# Patient Record
Sex: Female | Born: 1970
Health system: Southern US, Community
[De-identification: ages and names within clinical notes are randomized; demographics above are authoritative.]

## PROBLEM LIST (undated history)

## (undated) DIAGNOSIS — K219 Gastro-esophageal reflux disease without esophagitis: Secondary | ICD-10-CM

## (undated) DIAGNOSIS — J302 Other seasonal allergic rhinitis: Secondary | ICD-10-CM

## (undated) DIAGNOSIS — D649 Anemia, unspecified: Secondary | ICD-10-CM

## (undated) DIAGNOSIS — D259 Leiomyoma of uterus, unspecified: Secondary | ICD-10-CM

## (undated) DIAGNOSIS — D219 Benign neoplasm of connective and other soft tissue, unspecified: Secondary | ICD-10-CM

## (undated) DIAGNOSIS — T7840XA Allergy, unspecified, initial encounter: Secondary | ICD-10-CM

## (undated) HISTORY — DX: Gastro-esophageal reflux disease without esophagitis: K21.9

## (undated) HISTORY — DX: Leiomyoma of uterus, unspecified: D25.9

## (undated) HISTORY — PX: OTHER SURGICAL HISTORY: SHX169

## (undated) HISTORY — DX: Allergy, unspecified, initial encounter: T78.40XA

## (undated) HISTORY — DX: Other seasonal allergic rhinitis: J30.2

## (undated) HISTORY — DX: Anemia, unspecified: D64.9

---

## 2000-02-10 ENCOUNTER — Other Ambulatory Visit: Admission: RE | Admit: 2000-02-10 | Discharge: 2000-02-10 | Payer: Self-pay | Admitting: Family Medicine

## 2006-08-10 ENCOUNTER — Ambulatory Visit: Payer: Self-pay | Admitting: Internal Medicine

## 2006-08-17 ENCOUNTER — Ambulatory Visit: Payer: Self-pay | Admitting: Internal Medicine

## 2006-09-20 ENCOUNTER — Ambulatory Visit: Payer: Self-pay | Admitting: Internal Medicine

## 2007-06-24 DIAGNOSIS — Z87898 Personal history of other specified conditions: Secondary | ICD-10-CM | POA: Insufficient documentation

## 2007-06-25 ENCOUNTER — Ambulatory Visit: Payer: Self-pay | Admitting: Internal Medicine

## 2007-06-25 DIAGNOSIS — K648 Other hemorrhoids: Secondary | ICD-10-CM | POA: Insufficient documentation

## 2007-07-26 ENCOUNTER — Telehealth: Payer: Self-pay | Admitting: Internal Medicine

## 2007-08-09 ENCOUNTER — Ambulatory Visit: Payer: Self-pay | Admitting: Internal Medicine

## 2007-08-09 DIAGNOSIS — K5901 Slow transit constipation: Secondary | ICD-10-CM

## 2007-10-16 ENCOUNTER — Ambulatory Visit: Payer: Self-pay | Admitting: Internal Medicine

## 2007-10-16 LAB — CONVERTED CEMR LAB
ALT: 11 units/L (ref 0–35)
AST: 17 units/L (ref 0–37)
Albumin: 3.9 g/dL (ref 3.5–5.2)
BUN: 8 mg/dL (ref 6–23)
Basophils Absolute: 0 10*3/uL (ref 0.0–0.1)
Blood in Urine, dipstick: NEGATIVE
Calcium: 9.5 mg/dL (ref 8.4–10.5)
Chloride: 104 meq/L (ref 96–112)
Cholesterol: 128 mg/dL (ref 0–200)
Eosinophils Absolute: 0 10*3/uL (ref 0.0–0.6)
GFR calc non Af Amer: 120 mL/min
Glucose, Urine, Semiquant: NEGATIVE
MCHC: 33.6 g/dL (ref 30.0–36.0)
MCV: 80.8 fL (ref 78.0–100.0)
Platelets: 186 10*3/uL (ref 150–400)
RBC: 4.79 M/uL (ref 3.87–5.11)
Total CHOL/HDL Ratio: 2.8
Triglycerides: 23 mg/dL (ref 0–149)
WBC Urine, dipstick: NEGATIVE
WBC: 3.7 10*3/uL — ABNORMAL LOW (ref 4.5–10.5)
pH: 6

## 2007-10-31 ENCOUNTER — Ambulatory Visit: Payer: Self-pay | Admitting: Internal Medicine

## 2007-10-31 DIAGNOSIS — D509 Iron deficiency anemia, unspecified: Secondary | ICD-10-CM | POA: Insufficient documentation

## 2008-03-30 ENCOUNTER — Telehealth: Payer: Self-pay | Admitting: Internal Medicine

## 2008-03-31 ENCOUNTER — Ambulatory Visit: Payer: Self-pay | Admitting: Internal Medicine

## 2008-04-03 ENCOUNTER — Emergency Department (HOSPITAL_COMMUNITY): Admission: EM | Admit: 2008-04-03 | Discharge: 2008-04-04 | Payer: Self-pay | Admitting: Emergency Medicine

## 2008-05-08 ENCOUNTER — Ambulatory Visit: Payer: Self-pay | Admitting: Gastroenterology

## 2008-05-08 DIAGNOSIS — K5900 Constipation, unspecified: Secondary | ICD-10-CM | POA: Insufficient documentation

## 2008-06-10 ENCOUNTER — Telehealth: Payer: Self-pay | Admitting: Gastroenterology

## 2008-07-15 ENCOUNTER — Ambulatory Visit: Payer: Self-pay | Admitting: Internal Medicine

## 2008-07-15 DIAGNOSIS — M26609 Unspecified temporomandibular joint disorder, unspecified side: Secondary | ICD-10-CM | POA: Insufficient documentation

## 2008-10-23 ENCOUNTER — Ambulatory Visit: Payer: Self-pay | Admitting: Internal Medicine

## 2008-10-23 LAB — CONVERTED CEMR LAB
AST: 17 units/L (ref 0–37)
Albumin: 3.9 g/dL (ref 3.5–5.2)
Alkaline Phosphatase: 48 units/L (ref 39–117)
BUN: 7 mg/dL (ref 6–23)
Bilirubin, Direct: 0.1 mg/dL (ref 0.0–0.3)
Chloride: 106 meq/L (ref 96–112)
Eosinophils Absolute: 0.1 10*3/uL (ref 0.0–0.7)
Eosinophils Relative: 1.9 % (ref 0.0–5.0)
GFR calc non Af Amer: 100 mL/min
HDL: 50.5 mg/dL (ref >39.0)
MCV: 82.5 fL (ref 78.0–100.0)
Monocytes Relative: 10.6 % (ref 3.0–12.0)
Neutrophils Relative %: 43 % (ref 43.0–77.0)
Platelets: 170 10*3/uL (ref 150–400)
Potassium: 4.8 meq/L (ref 3.5–5.1)
RDW: 12.3 % (ref 11.5–14.6)
Sodium: 142 meq/L (ref 135–145)
Total Bilirubin: 0.7 mg/dL (ref 0.3–1.2)
Total CHOL/HDL Ratio: 2.9
VLDL: 5 mg/dL (ref 0–40)
WBC: 2.8 10*3/uL — ABNORMAL LOW (ref 4.5–10.5)

## 2008-10-30 ENCOUNTER — Ambulatory Visit: Payer: Self-pay | Admitting: Internal Medicine

## 2008-10-30 DIAGNOSIS — K219 Gastro-esophageal reflux disease without esophagitis: Secondary | ICD-10-CM | POA: Insufficient documentation

## 2009-01-15 ENCOUNTER — Telehealth: Payer: Self-pay | Admitting: Internal Medicine

## 2009-02-08 ENCOUNTER — Ambulatory Visit: Payer: Self-pay | Admitting: Gastroenterology

## 2009-03-18 ENCOUNTER — Encounter: Payer: Self-pay | Admitting: Internal Medicine

## 2009-05-19 LAB — CONVERTED CEMR LAB: Pap Smear: NORMAL

## 2009-11-03 ENCOUNTER — Ambulatory Visit: Payer: Self-pay | Admitting: Internal Medicine

## 2009-11-03 LAB — CONVERTED CEMR LAB
Bilirubin Urine: NEGATIVE
Nitrite: NEGATIVE
Protein, U semiquant: NEGATIVE
Urobilinogen, UA: 0.2

## 2010-08-31 ENCOUNTER — Ambulatory Visit: Payer: Self-pay | Admitting: Internal Medicine

## 2010-08-31 DIAGNOSIS — M549 Dorsalgia, unspecified: Secondary | ICD-10-CM | POA: Insufficient documentation

## 2010-08-31 LAB — CONVERTED CEMR LAB
Blood in Urine, dipstick: NEGATIVE
Glucose, Urine, Semiquant: NEGATIVE
Protein, U semiquant: NEGATIVE
Specific Gravity, Urine: 1.02
pH: 7

## 2010-09-01 ENCOUNTER — Encounter: Payer: Self-pay | Admitting: Internal Medicine

## 2010-09-11 LAB — CONVERTED CEMR LAB: GC Probe Amp, Urine: NEGATIVE

## 2010-09-21 ENCOUNTER — Telehealth: Payer: Self-pay | Admitting: Internal Medicine

## 2010-11-09 ENCOUNTER — Ambulatory Visit: Payer: Self-pay | Admitting: Internal Medicine

## 2010-11-09 LAB — CONVERTED CEMR LAB
Alkaline Phosphatase: 44 units/L (ref 39–117)
Basophils Relative: 0.6 % (ref 0.0–3.0)
Bilirubin, Direct: 0.1 mg/dL (ref 0.0–0.3)
CO2: 30 meq/L (ref 19–32)
Calcium: 9 mg/dL (ref 8.4–10.5)
Creatinine, Ser: 0.5 mg/dL (ref 0.4–1.2)
Eosinophils Absolute: 0.1 10*3/uL (ref 0.0–0.7)
GFR calc non Af Amer: 161.52 mL/min (ref >60.00)
HDL: 51.4 mg/dL (ref >39.00)
LDL Cholesterol: 98 mg/dL (ref 0–99)
Lymphocytes Relative: 57.5 % — ABNORMAL HIGH (ref 12.0–46.0)
MCHC: 33.6 g/dL (ref 30.0–36.0)
Monocytes Relative: 9.4 % (ref 3.0–12.0)
Neutrophils Relative %: 30.7 % — ABNORMAL LOW (ref 43.0–77.0)
RBC: 4.87 M/uL (ref 3.87–5.11)
Total CHOL/HDL Ratio: 3
Total Protein: 7.1 g/dL (ref 6.0–8.3)
Triglycerides: 22 mg/dL (ref 0.0–149.0)
VLDL: 4.4 mg/dL (ref 0.0–40.0)
WBC: 2.8 10*3/uL — ABNORMAL LOW (ref 4.5–10.5)

## 2010-11-28 ENCOUNTER — Ambulatory Visit
Admission: RE | Admit: 2010-11-28 | Discharge: 2010-11-28 | Payer: Self-pay | Source: Home / Self Care | Attending: Internal Medicine | Admitting: Internal Medicine

## 2010-12-18 LAB — CONVERTED CEMR LAB
AST: 16 units/L (ref 0–37)
Albumin: 3.9 g/dL (ref 3.5–5.2)
Alkaline Phosphatase: 40 units/L (ref 39–117)
Basophils Relative: 0.2 % (ref 0.0–3.0)
Bilirubin, Direct: 0 mg/dL (ref 0.0–0.3)
Calcium: 8.9 mg/dL (ref 8.4–10.5)
Creatinine, Ser: 0.6 mg/dL (ref 0.4–1.2)
Eosinophils Absolute: 0 10*3/uL (ref 0.0–0.7)
GFR calc non Af Amer: 143.79 mL/min (ref >60)
HDL: 48.8 mg/dL (ref >39.00)
Hemoglobin: 13 g/dL (ref 12.0–15.0)
LDL Cholesterol: 69 mg/dL (ref 0–99)
Lymphocytes Relative: 31.5 % (ref 12.0–46.0)
Monocytes Relative: 8.8 % (ref 3.0–12.0)
Neutro Abs: 2 10*3/uL (ref 1.4–7.7)
Neutrophils Relative %: 58.5 % (ref 43.0–77.0)
RBC: 4.67 M/uL (ref 3.87–5.11)
Sodium: 137 meq/L (ref 135–145)
Total CHOL/HDL Ratio: 3
Total Protein: 7.2 g/dL (ref 6.0–8.3)
Triglycerides: 34 mg/dL (ref 0.0–149.0)
VLDL: 6.8 mg/dL (ref 0.0–40.0)
WBC: 3.4 10*3/uL — ABNORMAL LOW (ref 4.5–10.5)

## 2010-12-20 NOTE — Assessment & Plan Note (Signed)
Summary: BLADDER INFECTION   Vital Signs:  Patient profile:   40 year old female Height:      65 inches Weight:      150 pounds BMI:     25.05 Temp:     98.2 degrees F oral Pulse rate:   80 / minute Resp:     14 per minute BP sitting:   110 / 70  (left arm)  Vitals Entered By: Willy Eddy, LPN (August 31, 2010 9:59 AM) CC: c/o dysuria Is Patient Diabetic? No   Primary Care Provider:  Darryll Capers MD  CC:  c/o dysuria.  History of Present Illness: lower back pain, pressure and frequency mild oder with the urination and a mild discharge with iritaton to uretha on wiping Just married Had been abstinant  prior has  been treated for irritaton by the GYN    Preventive Screening-Counseling & Management  Alcohol-Tobacco     Smoking Status: quit     Passive Smoke Exposure: no     Tobacco Counseling: to remain off tobacco products  Problems Prior to Update: 1)  Health Maintenance Exam  (ICD-V70.0) 2)  Gerd  (ICD-530.81) 3)  Unspecified Temporomandibular Joint Disorders  (ICD-524.60) 4)  Constipation  (ICD-564.00) 5)  Hemorrhoids-internal  (ICD-455.0) 6)  Acute Maxillary Sinusitis  (ICD-461.0) 7)  Other Specified Iron Deficiency Anemias  (ICD-280.8) 8)  Constipation, Slow Transit  (ICD-564.01) 9)  Hemorrhoids, Internal, With Bleeding  (ICD-455.2) 10)  Family History of Cad Female 1st Degree Relative <60  (ICD-V16.49) 11)  Family History of Cad Female 1st Degree Relative <50  (ICD-V17.3) 12)  Fibrocystic Breast Disease, Hx of  (ICD-V13.9)  Current Problems (verified): 1)  Uti  (ICD-599.0) 2)  Health Maintenance Exam  (ICD-V70.0) 3)  Gerd  (ICD-530.81) 4)  Unspecified Temporomandibular Joint Disorders  (ICD-524.60) 5)  Constipation  (ICD-564.00) 6)  Hemorrhoids-internal  (ICD-455.0) 7)  Acute Maxillary Sinusitis  (ICD-461.0) 8)  Other Specified Iron Deficiency Anemias  (ICD-280.8) 9)  Constipation, Slow Transit  (ICD-564.01) 10)  Hemorrhoids, Internal, With  Bleeding  (ICD-455.2) 11)  Family History of Cad Female 1st Degree Relative <60  (ICD-V16.49) 12)  Family History of Cad Female 1st Degree Relative <50  (ICD-V17.3) 13)  Fibrocystic Breast Disease, Hx of  (ICD-V13.9)  Medications Prior to Update: 1)  Therapeutic Multivitamin   Tabs (Multiple Vitamin) .... Once Daily 2)  Kapidex 60 Mg Cpdr (Dexlansoprazole) .... One Tablet By Mouth Once Daily 3)  Omega-3 1000 Mg Caps (Omega-3 Fatty Acids) .Marland Kitchen.. 1 Once Daily 4)  B Complex-B12  Tabs (B Complex Vitamins) .Marland Kitchen.. 1 Once Daily 5)  Cyclobenzaprine Hcl 5 Mg Tabs (Cyclobenzaprine Hcl) .... One Po Q Hs  Current Medications (verified): 1)  Therapeutic Multivitamin   Tabs (Multiple Vitamin) .... Once Daily 2)  Birth Control Pills 3)  Metrogel-Vaginal 0.75 % Gel (Metronidazole) .... Apply Per Vagina  For Days Q Hs  Allergies (verified): No Known Drug Allergies  Past History:  Family History: Last updated: 05/08/2008 Family History of CAD Female 1st degree relative <50 Family History of CAD Female 1st degree relative <60 Family History High cholesterol No FH of Colon Cancer: Family History of Diabetes: mother, father and maternal grandmother  Social History: Last updated: 07/15/2008 Occupation:church Single Former Smoker Alcohol Use - yes Daily Caffeine Use no sexual activity for 11 years  Risk Factors: Exercise: yes (07/15/2008)  Risk Factors: Smoking Status: quit (08/31/2010) Passive Smoke Exposure: no (08/31/2010)  Past medical, surgical, family and social histories (including  risk factors) reviewed, and no changes noted (except as noted below).  Past Medical History: Reviewed history from 10/30/2008 and no changes required. fibrocystic breast dz hemorrhoids GERD  Past Surgical History: Reviewed history from 06/25/2007 and no changes required. Denies surgical history  Family History: Reviewed history from 05/08/2008 and no changes required. Family History of CAD Female 1st  degree relative <50 Family History of CAD Female 1st degree relative <60 Family History High cholesterol No FH of Colon Cancer: Family History of Diabetes: mother, father and maternal grandmother  Social History: Reviewed history from 07/15/2008 and no changes required. Occupation:church Single Former Smoker Alcohol Use - yes Daily Caffeine Use no sexual activity for 11 years  Review of Systems  The patient denies anorexia, fever, weight loss, weight gain, vision loss, decreased hearing, hoarseness, chest pain, syncope, dyspnea on exertion, peripheral edema, prolonged cough, headaches, hemoptysis, abdominal pain, melena, hematochezia, severe indigestion/heartburn, hematuria, incontinence, genital sores, muscle weakness, suspicious skin lesions, transient blindness, difficulty walking, depression, unusual weight change, abnormal bleeding, enlarged lymph nodes, angioedema, and breast masses.    Physical Exam  General:  Well developed, well nourished, no acute distress. Head:  Normocephalic and atraumatic. Eyes:  pupils equal and pupils round.   Ears:  R ear normal.   Nose:  no external deformity.   Neck:  No deformities, masses, or tenderness noted. Lungs:  Clear throughout to auscultation. Heart:  Regular rate and rhythm; no murmurs, rubs,  or bruits. Abdomen:  Soft, nontender and nondistended. No masses, hepatosplenomegaly or hernias noted. Normal bowel sounds. Msk:  Symmetrical with no gross deformities. Normal posture. Neurologic:  alert & oriented X3 and gait normal.     Impression & Recommendations:  Problem # 1:  VAGINITIS, BACTERIAL (ICD-616.10)  Discussed symptomatic relief and treatment options.   Her updated medication list for this problem includes:    Metrogel-vaginal 0.75 % Gel (Metronidazole) .Marland Kitchen... Apply per vagina  for days q hs  Problem # 2:  UTI (ICD-599.0) the pt has symptoms that are more consistant with vaginosis but culture was sent and since she juyts  began activity (newly wed ) wil cover bases for STD Orders: UA Dipstick w/Micro (automated) (81001) T-Culture, Urine (82956-21308) T-GC Probe, urine (65784-69629) T-Chlamydia  Probe, urine (52841-32440)  Encouraged to push clear liquids, get enough rest, and take acetaminophen as needed. To be seen in 10 days if no improvement, sooner if worse.  Problem # 3:  BACK PAIN, ACUTE (ICD-724.5)  possible referred pain The following medications were removed from the medication list:    Cyclobenzaprine Hcl 5 Mg Tabs (Cyclobenzaprine hcl) ..... One po q hs  Discussed use of moist heat or ice, modified activities, medications, and stretching/strengthening exercises. Back care instructions given. To be seen in 2 weeks if no improvement; sooner if worsening of symptoms.   Complete Medication List: 1)  Therapeutic Multivitamin Tabs (Multiple vitamin) .... Once daily 2)  Birth Control Pills  3)  Metrogel-vaginal 0.75 % Gel (Metronidazole) .... Apply per vagina  for days q hs  Patient Instructions: 1)  Please schedule a follow-up appointment as needed. Prescriptions: METROGEL-VAGINAL 0.75 % GEL (METRONIDAZOLE) apply per vagina  for days q HS  #7 day x 0   Entered and Authorized by:   Stacie Glaze MD   Signed by:   Stacie Glaze MD on 08/31/2010   Method used:   Electronically to        [facility] [address] [phone]* (retail)  [address]       [place], [place]  [address]       Ph: [phone] or [phone]       Fax: [phone]   RxID:   [id number]   Laboratory Results   Urine Tests    Routine Urinalysis   Color: yellow Appearance: Clear Glucose: negative   (Normal Range: Negative) Bilirubin: negative   (Normal Range: Negative) Ketone: trace (5)   (Normal Range: Negative) Spec. Gravity: 1.020   (Normal Range: 1.003-1.035) Blood: negative   (Normal Range: Negative) pH: 7.0   (Normal Range: 5.0-8.0) Protein: negative   (Normal Range:  Negative) Urobilinogen: 0.2   (Normal Range: 0-1) Nitrite: negative   (Normal Range: Negative) Leukocyte Esterace: negative   (Normal Range: Negative)    Comments: Rita Ohara  August 31, 2010 10:00 AM

## 2010-12-20 NOTE — Progress Notes (Signed)
Summary: preg test?  Phone Note Call from Patient   Caller: Patient Call For: Stacie Glaze MD Summary of Call: Pt called to see if we did a pregnancy test on her the last visit when she had an possible UTI.  Notified no preg. test was performed. Initial call taken by: [facility] CMA AAMA,  September 21, 2010 1:23 PM

## 2010-12-22 NOTE — Assessment & Plan Note (Signed)
Summary: CPX/CJR   Vital Signs:  Patient profile:   40 year old female Height:      65 inches Weight:      142 pounds BMI:     23.72 Temp:     98.2 degrees F oral Pulse rate:   60 / minute Resp:     14 per minute BP sitting:   100 / 60  (left arm)  Vitals Entered By: Willy Eddy, LPN (November 28, 2010 3:19 PM) CC: cpx Is Patient Diabetic? No   Primary Care Provider:  Darryll Capers MD  CC:  cpx.  History of Present Illness: The pt was asked about all immunizations, health maint. services that are appropriate to their age and was given guidance on diet exercize  and weight management   Preventive Screening-Counseling & Management  Alcohol-Tobacco     Smoking Status: quit     Passive Smoke Exposure: no     Tobacco Counseling: to remain off tobacco products  Problems Prior to Update: 1)  Back Pain, Acute  (ICD-724.5) 2)  Vaginitis, Bacterial  (ICD-616.10) 3)  Uti  (ICD-599.0) 4)  Health Maintenance Exam  (ICD-V70.0) 5)  Gerd  (ICD-530.81) 6)  Unspecified Temporomandibular Joint Disorders  (ICD-524.60) 7)  Constipation  (ICD-564.00) 8)  Hemorrhoids-internal  (ICD-455.0) 9)  Acute Maxillary Sinusitis  (ICD-461.0) 10)  Other Specified Iron Deficiency Anemias  (ICD-280.8) 11)  Constipation, Slow Transit  (ICD-564.01) 12)  Hemorrhoids, Internal, With Bleeding  (ICD-455.2) 13)  Family History of Cad Female 1st Degree Relative <60  (ICD-V16.49) 14)  Family History of Cad Female 1st Degree Relative <50  (ICD-V17.3) 15)  Fibrocystic Breast Disease, Hx of  (ICD-V13.9)  Current Problems (verified): 1)  Back Pain, Acute  (ICD-724.5) 2)  Vaginitis, Bacterial  (ICD-616.10) 3)  Uti  (ICD-599.0) 4)  Health Maintenance Exam  (ICD-V70.0) 5)  Gerd  (ICD-530.81) 6)  Unspecified Temporomandibular Joint Disorders  (ICD-524.60) 7)  Constipation  (ICD-564.00) 8)  Hemorrhoids-internal  (ICD-455.0) 9)  Acute Maxillary Sinusitis  (ICD-461.0) 10)  Other Specified Iron Deficiency  Anemias  (ICD-280.8) 11)  Constipation, Slow Transit  (ICD-564.01) 12)  Hemorrhoids, Internal, With Bleeding  (ICD-455.2) 13)  Family History of Cad Female 1st Degree Relative <60  (ICD-V16.49) 14)  Family History of Cad Female 1st Degree Relative <50  (ICD-V17.3) 15)  Fibrocystic Breast Disease, Hx of  (ICD-V13.9)  Medications Prior to Update: 1)  Therapeutic Multivitamin   Tabs (Multiple Vitamin) .... Once Daily 2)  Birth Control Pills 3)  Metrogel-Vaginal 0.75 % Gel (Metronidazole) .... Apply Per Vagina  For Days Q Hs  Current Medications (verified): 1)  Therapeutic Multivitamin   Tabs (Multiple Vitamin) .... Once Daily 2)  Cavan Prenatal/ec Calcium 28-1 Mg Tbec (Prenatal W/o A Vit-Fe Fum-Fa) .... One By Mouth Daily As Directed  Allergies (verified): No Known Drug Allergies  Past History:  Family History: Last updated: 05/08/2008 Family History of CAD Female 1st degree relative <50 Family History of CAD Female 1st degree relative <60 Family History High cholesterol No FH of Colon Cancer: Family History of Diabetes: mother, father and maternal grandmother  Social History: Last updated: 07/15/2008 Occupation:church Single Former Smoker Alcohol Use - yes Daily Caffeine Use no sexual activity for 11 years  Risk Factors: Exercise: yes (07/15/2008)  Risk Factors: Smoking Status: quit (11/28/2010) Passive Smoke Exposure: no (11/28/2010)  Past medical, surgical, family and social histories (including risk factors) reviewed, and no changes noted (except as noted below).  Past Medical History: Reviewed  history from 10/30/2008 and no changes required. fibrocystic breast dz hemorrhoids GERD  Past Surgical History: Reviewed history from 06/25/2007 and no changes required. Denies surgical history  Family History: Reviewed history from 05/08/2008 and no changes required. Family History of CAD Female 1st degree relative <50 Family History of CAD Female 1st degree relative  <60 Family History High cholesterol No FH of Colon Cancer: Family History of Diabetes: mother, father and maternal grandmother  Social History: Reviewed history from 07/15/2008 and no changes required. Occupation:church Single Former Smoker Alcohol Use - yes Daily Caffeine Use no sexual activity for 11 years  Review of Systems  The patient denies anorexia, fever, weight loss, weight gain, vision loss, decreased hearing, hoarseness, chest pain, syncope, dyspnea on exertion, peripheral edema, prolonged cough, headaches, hemoptysis, abdominal pain, melena, hematochezia, severe indigestion/heartburn, hematuria, incontinence, genital sores, muscle weakness, suspicious skin lesions, transient blindness, difficulty walking, depression, unusual weight change, abnormal bleeding, enlarged lymph nodes, angioedema, and breast masses.    Physical Exam  General:  Well developed, well nourished, no acute distress. Head:  Normocephalic and atraumatic. Eyes:  pupils equal and pupils round.   Ears:  R ear normal.   Nose:  no external deformity.   Mouth:  No deformity or lesions, dentition normal. Neck:  No deformities, masses, or tenderness noted. Lungs:  Clear throughout to auscultation. Heart:  Regular rate and rhythm; no murmurs, rubs,  or bruits. Abdomen:  Soft, nontender and nondistended. No masses, hepatosplenomegaly or hernias noted. Normal bowel sounds. Msk:  Symmetrical with no gross deformities. Normal posture. Extremities:  No clubbing, cyanosis, edema or deformities noted. Neurologic:  alert & oriented X3 and gait normal.     Impression & Recommendations:  Problem # 1:  HEALTH MAINTENANCE EXAM (ICD-V70.0) Assessment Unchanged The pt was asked about all immunizations, health maint. services that are appropriate to their age and was given guidance on diet exercize  and weight management  Pap smear: normal (05/19/2009) Chol: 154 (11/09/2010)   HDL: 51.40 (11/09/2010)   LDL: 98  (11/09/2010)   TG: 22.0 (11/09/2010) TSH: 0.89 (11/09/2010)    Discussed using sunscreen, use of alcohol, drug use, self breast exam, routine dental care, routine eye care, schedule for GYN exam, routine physical exam, seat belts, multiple vitamins, osteoporosis prevention, adequate calcium intake in diet, recommendations for immunizations, mammograms and Pap smears.  Discussed exercise and checking cholesterol.  Discussed gun safety, safe sex, and contraception.  Complete Medication List: 1)  Therapeutic Multivitamin Tabs (Multiple vitamin) .... Once daily 2)  Cavan Prenatal/ec Calcium 28-1 Mg Tbec (Prenatal w/o a vit-fe fum-fa) .... One by mouth daily as directed  Patient Instructions: 1)  Please schedule a follow-up appointment in 1 year.  CPX Prescriptions: CAVAN PRENATAL/EC CALCIUM 28-1 MG TBEC (PRENATAL W/O A VIT-FE FUM-FA) one by mouth daily as directed  #30 x 11   Entered and Authorized by:   Stacie Glaze MD   Signed by:   Stacie Glaze MD on 11/28/2010   Method used:   Electronically to        [facility] [address] [phone]* (retail)       [address]       [place], [place]  [address]       Ph: [phone] or [phone]       Fax: [phone]   RxID:   [id number]    Orders Added: 1)  Est. Patient 18-39 years [phone]

## 2011-03-15 ENCOUNTER — Ambulatory Visit (INDEPENDENT_AMBULATORY_CARE_PROVIDER_SITE_OTHER): Payer: Managed Care, Other (non HMO) | Admitting: Internal Medicine

## 2011-03-15 ENCOUNTER — Encounter: Payer: Self-pay | Admitting: Internal Medicine

## 2011-03-15 VITALS — BP 110/76 | HR 76 | Temp 98.6°F | Resp 14 | Ht 65.5 in | Wt 140.0 lb

## 2011-03-15 DIAGNOSIS — N760 Acute vaginitis: Secondary | ICD-10-CM

## 2011-03-15 DIAGNOSIS — J0100 Acute maxillary sinusitis, unspecified: Secondary | ICD-10-CM

## 2011-03-15 DIAGNOSIS — A499 Bacterial infection, unspecified: Secondary | ICD-10-CM

## 2011-03-15 MED ORDER — FLUCONAZOLE 150 MG PO TABS: 150.0000 mg | ORAL_TABLET | Freq: Once | ORAL | Status: AC

## 2011-03-15 MED ORDER — PHENYLEPHRINE-APAP-GUAIFENESIN 10-650-400 MG/20ML PO LIQD: 5.0000 mL | Freq: Four times a day (QID) | ORAL | Status: DC | PRN

## 2011-03-15 MED ORDER — LEVOFLOXACIN 500 MG PO TABS: 500.0000 mg | ORAL_TABLET | Freq: Every day | ORAL | Status: AC

## 2011-03-15 NOTE — Assessment & Plan Note (Signed)
Patient has a history of recurrent maxillary sinusitis and its apparent that she had an episode. She also has eustachian tube dysfunction causing pain in her left ear she will need antibiotic therapy and appropriate decongestants because she is prone to yeast infection I will give her Diflucan

## 2011-03-15 NOTE — Progress Notes (Signed)
  Subjective:    Patient ID: Lauren Wiley, female    DOB: 1971-05-07, 40 y.o.   MRN: [id number]  Cough Associated symptoms include ear pain and rhinorrhea. Pertinent negatives include no eye redness, headaches, myalgias, postnasal drip, rash, shortness of breath or wheezing.  Otalgia  Associated symptoms include rhinorrhea. Pertinent negatives include no abdominal pain, coughing, headaches, neck pain or rash.   patient is a 40 year old African American female who presents with recurrent sinusitis.  Symptoms started on Saturday with initially a scratchy throat and nasal congestion now coughing with change in the color of her sputum.  She denies any fever or chills currently she has a history of recurrent sinusitis or seasonal component due to allergic rhinitis.      Review of Systems  Constitutional: Negative for activity change, appetite change and fatigue.  HENT: Positive for ear pain, congestion and rhinorrhea. Negative for neck pain, postnasal drip and sinus pressure.   Eyes: Negative for redness and visual disturbance.  Respiratory: Negative for cough, shortness of breath and wheezing.   Gastrointestinal: Negative for abdominal pain and abdominal distention.  Genitourinary: Negative for dysuria, frequency and menstrual problem.  Musculoskeletal: Negative for myalgias, joint swelling and arthralgias.  Skin: Negative for rash and wound.  Neurological: Negative for dizziness, weakness and headaches.  Hematological: Negative for adenopathy. Does not bruise/bleed easily.  Psychiatric/Behavioral: Negative for sleep disturbance and decreased concentration.   History reviewed. No pertinent past medical history. History reviewed. No pertinent past surgical history.  reports that she quit smoking about 20 years ago. She does not have any smokeless tobacco history on file. She reports that she does not drink alcohol or use illicit drugs. family history is not on file. Allergies no  known allergies     Objective:   Physical Exam  Constitutional: She is oriented to person, place, and time. She appears well-developed and well-nourished. No distress.  HENT:  Head: Normocephalic and atraumatic.  Right Ear: External ear normal.  Left Ear: External ear normal.  Nose: Nose normal.  Mouth/Throat: No oropharyngeal exudate.       Oropharynx posterior cobblestoning turbinates were erythematous and swollen there was airflow obstruction  Eyes: Conjunctivae and EOM are normal. Pupils are equal, round, and reactive to light.  Neck: Normal range of motion. Neck supple. No JVD present. No tracheal deviation present. No thyromegaly present.  Cardiovascular: Normal rate, regular rhythm, normal heart sounds and intact distal pulses.   No murmur heard. Pulmonary/Chest: Effort normal and breath sounds normal. She has no wheezes. She exhibits no tenderness.  Abdominal: Soft. Bowel sounds are normal.  Musculoskeletal: Normal range of motion. She exhibits no edema and no tenderness.  Lymphadenopathy:    She has no cervical adenopathy.  Neurological: She is alert and oriented to person, place, and time. She has normal reflexes. No cranial nerve deficit.  Skin: Skin is warm and dry. She is not diaphoretic.  Psychiatric: She has a normal mood and affect. Her behavior is normal.          Assessment & Plan:  Recurrent sinusitis will use Levaquin 500 mg for 10 days she'll be covered for probable yeast infection with Diflucan now and in 7 days for decongestant we will use Mucinex fast max liquid she is given a prescription for these medications and instructions about her sinusitis if symptoms persist a sinus x-ray should be obtained

## 2011-04-07 NOTE — Assessment & Plan Note (Signed)
[facility]                                   ON-CALL NOTE   NAME:Lauren Wiley, Lauren Wiley                      MRN:          [id number]  DATE:09/08/2006                            DOB:          January 11, 1971    Caller is [employer].   PRIMARY:  Dr. Lovell Sheehan.   PHONE NUMBER:  [phone]   SUBJECTIVE:  Left ear pain for 4 days.  Symptoms go away with Aleve, but  then come back.  The patient also has some sinus drainage and pressure.   ASSESSMENT AND PLAN:  Saturday Clinic is now over.  I recommend Claritin D  and an appointment with primary care doctor on Monday.       Kerby Nora, MD      AB/MedQ  DD:  09/09/2006  DT:  09/10/2006  Job #:  [id number]   cc:   Stacie Glaze, MD

## 2011-12-04 ENCOUNTER — Other Ambulatory Visit (HOSPITAL_COMMUNITY): Payer: Self-pay | Admitting: Obstetrics and Gynecology

## 2011-12-04 DIAGNOSIS — N979 Female infertility, unspecified: Secondary | ICD-10-CM

## 2011-12-08 ENCOUNTER — Ambulatory Visit (HOSPITAL_COMMUNITY)
Admission: RE | Admit: 2011-12-08 | Discharge: 2011-12-08 | Disposition: A | Discharge status: Home / Self Care | Payer: Managed Care, Other (non HMO) | Source: Ambulatory Visit | Attending: Obstetrics and Gynecology | Admitting: Obstetrics and Gynecology

## 2011-12-08 DIAGNOSIS — N979 Female infertility, unspecified: Secondary | ICD-10-CM | POA: Insufficient documentation

## 2011-12-08 MED ORDER — IOHEXOL 300 MG/ML  SOLN
20.0000 mL | Freq: Once | INTRAMUSCULAR | Status: AC | PRN
  Administered 2011-12-08: 20 mL

## 2012-03-21 ENCOUNTER — Other Ambulatory Visit: Payer: Self-pay | Admitting: Obstetrics and Gynecology

## 2012-03-21 DIAGNOSIS — Z1231 Encounter for screening mammogram for malignant neoplasm of breast: Secondary | ICD-10-CM

## 2012-03-28 ENCOUNTER — Ambulatory Visit
Admission: RE | Admit: 2012-03-28 | Discharge: 2012-03-28 | Disposition: A | Discharge status: Home / Self Care | Payer: Managed Care, Other (non HMO) | Source: Ambulatory Visit | Attending: Obstetrics and Gynecology | Admitting: Obstetrics and Gynecology

## 2012-03-28 DIAGNOSIS — Z1231 Encounter for screening mammogram for malignant neoplasm of breast: Secondary | ICD-10-CM

## 2013-11-19 ENCOUNTER — Encounter: Payer: Self-pay | Admitting: Family Medicine

## 2013-11-19 ENCOUNTER — Ambulatory Visit (INDEPENDENT_AMBULATORY_CARE_PROVIDER_SITE_OTHER): Payer: BC Managed Care – PPO | Admitting: Family Medicine

## 2013-11-19 VITALS — BP 116/83 | HR 88 | Temp 99.2°F | Resp 16 | Ht 65.5 in | Wt 158.0 lb

## 2013-11-19 DIAGNOSIS — J019 Acute sinusitis, unspecified: Secondary | ICD-10-CM

## 2013-11-19 DIAGNOSIS — Z23 Encounter for immunization: Secondary | ICD-10-CM

## 2013-11-19 MED ORDER — AMOXICILLIN-POT CLAVULANATE 875-125 MG PO TABS: 1.0000 | ORAL_TABLET | Freq: Two times a day (BID) | ORAL | Status: DC

## 2013-11-19 NOTE — Progress Notes (Signed)
OFFICE NOTE  11/19/2013  CC:  Chief Complaint  Patient presents with  . Sinus Problem     HPI: Patient is a 42 y.o. African-American female who is here for sinus congestion. Onset about 1 wk ago, nasal/sinus congestion, ear starting to ache, some PND and coughing. No fever.  No pain in face or upper teeth.  No wheezing or chest tightness.  Some raw throat. Mucinex D and flonase used so far and not much help.   Pertinent PMH:  Constipation Hx of iron def anemia Int hemorrhoids Hx of maxillary sinusitis Former smoker: quit 1992  MEDS:  None except as per HPI  PE: Blood pressure 116/83, pulse 88, temperature 99.2 F (37.3 C), temperature source Temporal, resp. rate 16, height 5' 5.5" (1.664 m), weight 158 lb (71.668 kg), last menstrual period 11/11/2013, SpO2 98.00%. VS: noted--normal. Gen: alert, NAD, NONTOXIC APPEARING. HEENT: eyes without injection, drainage, or swelling.  Ears: EACs clear, TMs with normal light reflex and landmarks.  Nose: Clear rhinorrhea, with some dried, crusty exudate adherent to mildly injected mucosa.  No purulent d/c.  No paranasal sinus TTP.  No facial swelling.  Throat and mouth without focal lesion.  No pharyngial swelling, erythema, or exudate.   Neck: supple, no LAD.   LUNGS: CTA bilat, nonlabored resps.   CV: RRR, no m/r/g. EXT: no c/c/e SKIN: no rash  LAB: none  IMPRESSION AND PLAN:  Acute sinusitis:  augmentin 875mg  bid x 10d. Saline nasal spray. Fluids, rest.  FOLLOW UP: prn

## 2013-11-19 NOTE — Addendum Note (Signed)
Addended by: Eulah Pont on: 11/19/2013 01:25 PM   Modules accepted: Orders

## 2013-11-19 NOTE — Patient Instructions (Signed)
Buy generic OTC saline nasal spray and use several times per day to irrigate your nasal passages.

## 2013-11-19 NOTE — Progress Notes (Signed)
Pre visit review using our clinic review tool, if applicable. No additional management support is needed unless otherwise documented below in the visit note. 

## 2014-02-24 ENCOUNTER — Other Ambulatory Visit (INDEPENDENT_AMBULATORY_CARE_PROVIDER_SITE_OTHER): Payer: BC Managed Care – PPO

## 2014-02-24 DIAGNOSIS — Z0000 Encounter for general adult medical examination without abnormal findings: Secondary | ICD-10-CM

## 2014-02-24 LAB — BASIC METABOLIC PANEL
BUN: 9 mg/dL (ref 6–23)
CHLORIDE: 102 meq/L (ref 96–112)
CO2: 27 meq/L (ref 19–32)
Calcium: 9.2 mg/dL (ref 8.4–10.5)
Creatinine, Ser: 0.6 mg/dL (ref 0.4–1.2)
GFR: 146.31 mL/min (ref >60.00)
GLUCOSE: 83 mg/dL (ref 70–99)
POTASSIUM: 4.5 meq/L (ref 3.5–5.1)
SODIUM: 138 meq/L (ref 135–145)

## 2014-02-24 LAB — CBC WITH DIFFERENTIAL/PLATELET
BASOS ABS: 0 10*3/uL (ref 0.0–0.1)
Basophils Relative: 0.6 % (ref 0.0–3.0)
EOS ABS: 0 10*3/uL (ref 0.0–0.7)
Eosinophils Relative: 1.3 % (ref 0.0–5.0)
HEMATOCRIT: 39 % (ref 36.0–46.0)
HEMOGLOBIN: 12.8 g/dL (ref 12.0–15.0)
LYMPHS ABS: 1.1 10*3/uL (ref 0.7–4.0)
Lymphocytes Relative: 39.4 % (ref 12.0–46.0)
MCHC: 32.9 g/dL (ref 30.0–36.0)
MCV: 80.3 fl (ref 78.0–100.0)
MONO ABS: 0.4 10*3/uL (ref 0.1–1.0)
MONOS PCT: 13.3 % — AB (ref 3.0–12.0)
NEUTROS ABS: 1.2 10*3/uL — AB (ref 1.4–7.7)
Neutrophils Relative %: 45.4 % (ref 43.0–77.0)
PLATELETS: 178 10*3/uL (ref 150.0–400.0)
RBC: 4.86 Mil/uL (ref 3.87–5.11)
RDW: 13.5 % (ref 11.5–14.6)
WBC: 2.7 10*3/uL — ABNORMAL LOW (ref 4.5–10.5)

## 2014-02-24 LAB — TSH: TSH: 0.74 u[IU]/mL (ref 0.35–5.50)

## 2014-02-24 LAB — POCT URINALYSIS DIPSTICK
BILIRUBIN UA: NEGATIVE
GLUCOSE UA: NEGATIVE
Ketones, UA: NEGATIVE
LEUKOCYTES UA: NEGATIVE
NITRITE UA: NEGATIVE
PH UA: 7
Protein, UA: NEGATIVE
RBC UA: NEGATIVE
Spec Grav, UA: 1.01
UROBILINOGEN UA: 0.2

## 2014-02-24 LAB — LIPID PANEL
CHOL/HDL RATIO: 3
Cholesterol: 152 mg/dL (ref 0–200)
HDL: 60.1 mg/dL (ref >39.00)
LDL Cholesterol: 86 mg/dL (ref 0–99)
TRIGLYCERIDES: 28 mg/dL (ref 0.0–149.0)
VLDL: 5.6 mg/dL (ref 0.0–40.0)

## 2014-02-24 LAB — HEPATIC FUNCTION PANEL
ALBUMIN: 4 g/dL (ref 3.5–5.2)
ALT: 18 U/L (ref 0–35)
AST: 20 U/L (ref 0–37)
Alkaline Phosphatase: 49 U/L (ref 39–117)
BILIRUBIN TOTAL: 0.4 mg/dL (ref 0.3–1.2)
Bilirubin, Direct: 0 mg/dL (ref 0.0–0.3)
Total Protein: 7.1 g/dL (ref 6.0–8.3)

## 2014-03-02 ENCOUNTER — Encounter: Payer: Self-pay | Admitting: Family

## 2014-03-02 ENCOUNTER — Ambulatory Visit (INDEPENDENT_AMBULATORY_CARE_PROVIDER_SITE_OTHER): Payer: BC Managed Care – PPO | Admitting: Family

## 2014-03-02 VITALS — BP 120/78 | HR 82 | Ht 66.0 in | Wt 161.0 lb

## 2014-03-02 DIAGNOSIS — Z0000 Encounter for general adult medical examination without abnormal findings: Secondary | ICD-10-CM

## 2014-03-02 DIAGNOSIS — D72819 Decreased white blood cell count, unspecified: Secondary | ICD-10-CM

## 2014-03-02 NOTE — Progress Notes (Signed)
   Subjective:    Patient ID: Lauren Wiley, female    DOB: December 19, 1970, 43 y.o.   MRN: [id number]  HPI  43 year old AAF, Routine wellness  examination for this patient . I reviewed all health maintenance protocols including mammography, colonoscopy, bone density Needed referrals were placed. Age and diagnosis  appropriate screening labs were ordered. Her immunization history was reviewed and appropriate vaccinations were ordered. Her current medications and allergies were reviewed and needed refills of her chronic medications were ordered. The plan for yearly health maintenance was discussed all orders and referrals were made as appropriate.  Review of Systems  Constitutional: Negative.   HENT: Negative.   Eyes: Negative.   Respiratory: Negative.   Cardiovascular: Negative.   Gastrointestinal: Negative.   Endocrine: Negative.   Genitourinary: Negative.   Musculoskeletal: Negative.   Skin: Negative.   Allergic/Immunologic: Negative.   Neurological: Negative.   Hematological: Negative.   Psychiatric/Behavioral: Negative.    History reviewed. No pertinent past medical history.  History   Social History  . Marital Status: Single    Spouse Name: N/A    Number of Children: N/A  . Years of Education: N/A   Occupational History  . Not on file.   Social History Main Topics  . Smoking status: Former Smoker    Quit date: 11/20/1990  . Smokeless tobacco: Not on file  . Alcohol Use: No  . Drug Use: No  . Sexual Activity: Yes   Other Topics Concern  . Not on file   Social History Narrative  . No narrative on file    History reviewed. No pertinent past surgical history.  History reviewed. No pertinent family history.  No Known Allergies  No current outpatient prescriptions on file prior to visit.   No current facility-administered medications on file prior to visit.    BP 120/78  Pulse 82  Ht 5\' 6"  (1.676 m)  Wt 161 lb (73.029 kg)  BMI 26.00 kg/m2  SpO2  98%chart    Objective:   Physical Exam  Constitutional: She is oriented to person, place, and time. She appears well-developed and well-nourished.  HENT:  Right Ear: External ear normal.  Left Ear: External ear normal.  Nose: Nose normal.  Mouth/Throat: Oropharynx is clear and moist.  Eyes: Conjunctivae and EOM are normal. Pupils are equal, round, and reactive to light.  Neck: Normal range of motion. Neck supple.  Cardiovascular: Normal rate, regular rhythm and normal heart sounds.   Pulmonary/Chest: Effort normal and breath sounds normal.  Abdominal: Soft. Bowel sounds are normal.  Musculoskeletal: Normal range of motion.  Neurological: She is alert and oriented to person, place, and time. She has normal reflexes.  Skin: Skin is warm and dry.  Psychiatric: She has a normal mood and affect.          Assessment & Plan:  Lauren Wiley was seen today for annual exam.  Diagnoses and associated orders for this visit:  Preventative health care   Call the office with any questions or concerns. Recheck in 1 year and sooner as needed.

## 2014-03-02 NOTE — Progress Notes (Signed)
Pre visit review using our clinic review tool, if applicable. No additional management support is needed unless otherwise documented below in the visit note. 

## 2014-03-02 NOTE — Patient Instructions (Signed)
Exercise to Stay Healthy Exercise helps you become and stay healthy. EXERCISE IDEAS AND TIPS Choose exercises that:  You enjoy.  Fit into your day. You do not need to exercise really hard to be healthy. You can do exercises at a slow or medium level and stay healthy. You can:  Stretch before and after working out.  Try yoga, Pilates, or tai chi.  Lift weights.  Walk fast, swim, jog, run, climb stairs, bicycle, dance, or rollerskate.  Take aerobic classes. Exercises that burn about 150 calories:  Running 1  miles in 15 minutes.  Playing volleyball for 45 to 60 minutes.  Washing and waxing a car for 45 to 60 minutes.  Playing touch football for 45 minutes.  Walking 1  miles in 35 minutes.  Pushing a stroller 1  miles in 30 minutes.  Playing basketball for 30 minutes.  Raking leaves for 30 minutes.  Bicycling 5 miles in 30 minutes.  Walking 2 miles in 30 minutes.  Dancing for 30 minutes.  Shoveling snow for 15 minutes.  Swimming laps for 20 minutes.  Walking up stairs for 15 minutes.  Bicycling 4 miles in 15 minutes.  Gardening for 30 to 45 minutes.  Jumping rope for 15 minutes.  Washing windows or floors for 45 to 60 minutes. Document Released: 12/09/2010 Document Revised: 01/29/2012 Document Reviewed: 12/09/2010 [facility] Patient Information 2014 [place], [place].

## 2014-03-03 ENCOUNTER — Telehealth: Payer: Self-pay | Admitting: Oncology

## 2014-03-04 ENCOUNTER — Telehealth: Payer: Self-pay | Admitting: Oncology

## 2014-03-04 NOTE — Telephone Encounter (Signed)
LEFT MESSAGE FOR PATIENT TO RETURN CALL TO SCHEDULE NEW PATIENT APPT.  °

## 2014-03-06 ENCOUNTER — Telehealth: Payer: Self-pay | Admitting: Oncology

## 2014-03-06 NOTE — Telephone Encounter (Signed)
LEFT MESSAGE FOR PATIENT TO RETURN CALL TO SCHEDULE NEW PATIENT APPT.  °

## 2014-03-09 ENCOUNTER — Telehealth: Payer: Self-pay | Admitting: Oncology

## 2014-03-09 NOTE — Telephone Encounter (Signed)
LEFT MESSAGE FOR PATIENT TO RETURN CALL TO SCHEDULE NEW PATIENT APPT.  °

## 2014-03-11 ENCOUNTER — Telehealth: Payer: Self-pay | Admitting: Oncology

## 2014-03-11 NOTE — Telephone Encounter (Signed)
S/W PATIENT AND GAVE NP APPT FOR 05/13 @ 10:30 W/DR. SHADAD.  REFERRING DR. Tolar PACKET MAILED.

## 2014-03-12 ENCOUNTER — Telehealth: Payer: Self-pay | Admitting: Oncology

## 2014-03-12 NOTE — Telephone Encounter (Signed)
C/D 03/12/14 for appt. 04/01/14 °

## 2014-03-12 NOTE — Telephone Encounter (Signed)
C/D 03/12/14 for appt. 04/01/14

## 2014-03-27 ENCOUNTER — Other Ambulatory Visit: Payer: Self-pay | Admitting: Oncology

## 2014-03-27 DIAGNOSIS — D72819 Decreased white blood cell count, unspecified: Secondary | ICD-10-CM

## 2014-04-01 ENCOUNTER — Encounter (INDEPENDENT_AMBULATORY_CARE_PROVIDER_SITE_OTHER): Payer: Self-pay

## 2014-04-01 ENCOUNTER — Other Ambulatory Visit (HOSPITAL_BASED_OUTPATIENT_CLINIC_OR_DEPARTMENT_OTHER): Payer: BC Managed Care – PPO

## 2014-04-01 ENCOUNTER — Ambulatory Visit (HOSPITAL_BASED_OUTPATIENT_CLINIC_OR_DEPARTMENT_OTHER): Payer: BC Managed Care – PPO

## 2014-04-01 ENCOUNTER — Encounter: Payer: Self-pay | Admitting: Oncology

## 2014-04-01 ENCOUNTER — Ambulatory Visit (HOSPITAL_BASED_OUTPATIENT_CLINIC_OR_DEPARTMENT_OTHER): Payer: BC Managed Care – PPO | Admitting: Oncology

## 2014-04-01 VITALS — BP 110/66 | HR 94 | Temp 97.8°F | Resp 18 | Ht 66.0 in | Wt 158.0 lb

## 2014-04-01 DIAGNOSIS — D72819 Decreased white blood cell count, unspecified: Secondary | ICD-10-CM

## 2014-04-01 LAB — CBC WITH DIFFERENTIAL/PLATELET
BASO%: 0.6 % (ref 0.0–2.0)
BASOS ABS: 0 10*3/uL (ref 0.0–0.1)
EOS%: 1.8 % (ref 0.0–7.0)
Eosinophils Absolute: 0.1 10*3/uL (ref 0.0–0.5)
HEMATOCRIT: 40.3 % (ref 34.8–46.6)
HEMOGLOBIN: 12.8 g/dL (ref 11.6–15.9)
LYMPH#: 1.4 10*3/uL (ref 0.9–3.3)
LYMPH%: 42.4 % (ref 14.0–49.7)
MCH: 25.7 pg (ref 25.1–34.0)
MCHC: 31.9 g/dL (ref 31.5–36.0)
MCV: 80.7 fL (ref 79.5–101.0)
MONO#: 0.4 10*3/uL (ref 0.1–0.9)
MONO%: 12.3 % (ref 0.0–14.0)
NEUT#: 1.4 10*3/uL — ABNORMAL LOW (ref 1.5–6.5)
NEUT%: 42.9 % (ref 38.4–76.8)
PLATELETS: 188 10*3/uL (ref 145–400)
RBC: 4.99 10*6/uL (ref 3.70–5.45)
RDW: 13.4 % (ref 11.2–14.5)
WBC: 3.3 10*3/uL — AB (ref 3.9–10.3)

## 2014-04-01 LAB — COMPREHENSIVE METABOLIC PANEL (CC13)
ALT: 13 U/L (ref 0–55)
ANION GAP: 10 meq/L (ref 3–11)
AST: 20 U/L (ref 5–34)
Albumin: 3.8 g/dL (ref 3.5–5.0)
Alkaline Phosphatase: 55 U/L (ref 40–150)
BUN: 8.7 mg/dL (ref 7.0–26.0)
CALCIUM: 9.4 mg/dL (ref 8.4–10.4)
CHLORIDE: 108 meq/L (ref 98–109)
CO2: 23 meq/L (ref 22–29)
CREATININE: 0.8 mg/dL (ref 0.6–1.1)
GLUCOSE: 71 mg/dL (ref 70–140)
Potassium: 4.2 mEq/L (ref 3.5–5.1)
Sodium: 142 mEq/L (ref 136–145)
Total Bilirubin: 0.35 mg/dL (ref 0.20–1.20)
Total Protein: 7.1 g/dL (ref 6.4–8.3)

## 2014-04-01 LAB — CHCC SMEAR

## 2014-04-01 NOTE — Consult Note (Signed)
Reason for Referral: Leukocytopenia.   HPI: 43 year old African American woman currently of [place] where she lived since 20. She is a rather healthy woman with history of seasonal allergies but otherwise no other comorbid conditions. She was noted by her primary care provider a decrease in her white cell count with her most recent CBC shows a total white cell count of 2.7. She did have a slight increase in her monocytes with a monocyte percentage of 13.3. Otherwise her differential was perfectly normal. Her hemoglobin was 12.8 with a normal MCV. Her platelet count 178. She have been told about this in the past by her previous primary care physician. Her white cell count has fluctuated as high as 3.7 and as low as 2.3 since 2008. Clinically, she has no symptoms. She denied any fevers or chills or sweats. Her denied any lymphadenopathy or constitutional symptoms. She continued to work full time without any decline in her energy or performance status. She has not reported any easy bruising or bleeding. She has not reported any abdominal pain or distention. Has not reported any early satiety or decline in her energy.   Her past medical history is significant for seasonal allergies but no history of hypertension diabetes or coronary artery disease.   She denied any surgeries in the past. She denies any pregnancies in the past.   Current Outpatient Prescriptions  Medication Sig Dispense Refill  . cetirizine (ZYRTEC) 10 MG tablet Take 10 mg by mouth daily as needed for allergies.       No current facility-administered medications for this visit.     No Known Allergies:   Her family history is significant for coronary artery disease, hypertension and diabetes. There is no history of any blood disorders or malignancies.   History   Social History  . Marital Status: Single    Spouse Name: N/A    Number of Children: N/A  . Years of Education: N/A   Occupational History  . Not on file.    Social History Main Topics  . Smoking status: Former Smoker    Quit date: 11/20/1990  . Smokeless tobacco: Not on file  . Alcohol Use: No  . Drug Use: No  . Sexual Activity: Yes   Other Topics Concern  . Not on file   Social History Narrative  . No narrative on file  :  Constitutional: negative for anorexia, malaise and night sweats Ears, nose, mouth, throat, and face: negative for tinnitus and voice change Respiratory: negative for chronic bronchitis, dyspnea on exertion and pleurisy/chest pain Cardiovascular: negative for orthopnea, palpitations and paroxysmal nocturnal dyspnea Gastrointestinal: negative for abdominal pain, melena and odynophagia Genitourinary:negative for frequency, hematuria and hesitancy Integument/breast: negative for pruritus and rash Hematologic/lymphatic: negative for bleeding, easy bruising, lymphadenopathy and petechiae Musculoskeletal:negative for arthralgias, back pain and stiff joints Neurological: negative for paresthesia, vertigo and weakness  Exam: ECOG 0 Blood pressure 110/66, pulse 94, temperature 97.8 F (36.6 C), temperature source Oral, resp. rate 18, height 5\' 6"  (1.676 m), weight 158 lb (71.668 kg), SpO2 100.00%. General appearance: alert, cooperative and appears stated age Head: Normocephalic, without obvious abnormality Neck: no adenopathy, no carotid bruit, no JVD and supple, symmetrical, trachea midline Back: symmetric, no curvature. ROM normal. No CVA tenderness. Resp: clear to auscultation bilaterally Chest wall: no tenderness Cardio: regular rate and rhythm, S1, S2 normal, no murmur, click, rub or gallop GI: soft, non-tender; bowel sounds normal; no masses,  no organomegaly Extremities: extremities normal, atraumatic, no cyanosis or edema Pulses:  2+ and symmetric Skin: Skin color, texture, turgor normal. No rashes or lesions Lymph nodes: Cervical, supraclavicular, and axillary nodes normal. Neurologic: Grossly  normal   Recent Labs  04/01/14 1033  WBC 3.3*  HGB 12.8  HCT 40.3  PLT 188    Recent Labs  04/01/14 1033  NA 142  K 4.2  CO2 23  GLUCOSE 71  BUN 8.7  CREATININE 0.8  CALCIUM 9.4     Blood smear review: Peripheral smear was personally reviewed today and showed no abnormalities. Her white cell count is perfectly normal. The red cells appeared slightly hypochromic.   Assessment and Plan:   43 year old woman with fluctuating leukocytopenia. Her white cell count today is 3.3 with a normal differential. Her white cell count has fluctuated as low as 2.7 and as high as 3.7 dating back to 2008. Her differential have always been proportionally normal. The differential diagnosis was discussed with the patient today. Most likely this is a benign fluctuation related to her ethnic variation rather than a true hematological abnormality. Other condition such as lymphoproliferative disorder or myeloproliferative disorder are extremely unlikely. Viral infections , autoimmune diseases and medications could also be on the differential but also on likely.  For management standpoint, I see no reason for any intervention or further workup at this time. She is not at risk for any recurrent infections at this time. She does not really need any further hematological workup at this time unless any other abnormalities arise. I will be happy to see her in the future as needed if any issues arise.

## 2014-04-01 NOTE — Progress Notes (Signed)
Please see consult note.  

## 2014-04-01 NOTE — Progress Notes (Signed)
No financial issues as of today, because she has not seen the dr yet. She has not been out of country

## 2014-09-28 ENCOUNTER — Ambulatory Visit (INDEPENDENT_AMBULATORY_CARE_PROVIDER_SITE_OTHER): Payer: BC Managed Care – PPO | Admitting: Family Medicine

## 2014-09-28 ENCOUNTER — Encounter: Payer: Self-pay | Admitting: Family Medicine

## 2014-09-28 VITALS — BP 110/80 | Temp 98.8°F | Wt 160.0 lb

## 2014-09-28 DIAGNOSIS — Z23 Encounter for immunization: Secondary | ICD-10-CM

## 2014-09-28 DIAGNOSIS — J309 Allergic rhinitis, unspecified: Secondary | ICD-10-CM | POA: Insufficient documentation

## 2014-09-28 DIAGNOSIS — J301 Allergic rhinitis due to pollen: Secondary | ICD-10-CM

## 2014-09-28 MED ORDER — PREDNISONE 20 MG PO TABS: ORAL_TABLET | ORAL | Status: DC

## 2014-09-28 NOTE — Progress Notes (Signed)
Pre visit review using our clinic review tool, if applicable. No additional management support is needed unless otherwise documented below in the visit note. 

## 2014-09-28 NOTE — Patient Instructions (Signed)
Prednisone 20 mg........ Take 2 tablets now.......... Then one daily for 3 days, half a tab for 3 days, then a half a tablet Monday Wednesday Friday for a two-week taper  Hold the Zyrtec until you start tapering ............. Then restart the Zyrtec

## 2014-09-28 NOTE — Progress Notes (Signed)
   Subjective:    Patient ID: Lauren Wiley, female    DOB: 24-Aug-1971, 43 y.o.   MRN: [id number]  HPI Mr. Helmes is a 43 year old female nonsmoker who comes in with a three-day history of allergic rhinitis manifested by head congestion postnasal drip and slight cough. No fever chills sore throat etc. Etc.  She has a long-standing history of allergic rhinitis and takes Zyrtec 10 mg daily.  In the past she's been told she had sinusitis........... However her symptoms are inconsistent with infection and more consistent with allergic rhinitis   Review of Systems Review of systems negative    Objective:   Physical Exam  Well-developed well-nourished female no acute distress HEENT was pertinent in the septum was in the midline bilateral turbinate enlargement 4+ oral cavity normal except for a lot of postnasal drip lungs are clear to auscultation      Assessment & Plan:  , allergic rhinitis,,,,,,,,,,,,,,,, on Zyrtec,,,,,,,,,,,, short dose of prednisone burst and taper

## 2015-01-06 ENCOUNTER — Encounter: Payer: Self-pay | Admitting: Family

## 2015-01-06 ENCOUNTER — Ambulatory Visit (INDEPENDENT_AMBULATORY_CARE_PROVIDER_SITE_OTHER): Payer: BLUE CROSS/BLUE SHIELD | Admitting: Family

## 2015-01-06 ENCOUNTER — Telehealth: Payer: Self-pay | Admitting: Family

## 2015-01-06 DIAGNOSIS — R002 Palpitations: Secondary | ICD-10-CM

## 2015-01-06 NOTE — Telephone Encounter (Signed)
Patient Name: Lauren Wiley DOB: Apr 01, 1971 Initial Comment Caller states c/o heart palpitations Nurse Assessment Nurse: Vallery Sa, RN, Cathy Date/Time (Eastern Time): 01/06/2015 12:08:54 PM Confirm and document reason for call. If symptomatic, describe symptoms. ---Caller states she developed irregular heart beats last year that became worse this morning. No severe breathing difficulty. No chest pain. No injury in the past 3 days. No fever. Has the patient traveled out of the country within the last 30 days? ---No Does the patient require triage? ---Yes Related visit to physician within the last 2 weeks? ---No Does the PT have any chronic conditions? (i.e. diabetes, asthma, etc.) ---Yes List chronic conditions. ---Irregular heart beats in the past Did the patient indicate they were pregnant? ---No Guidelines Guideline Title Affirmed Question Affirmed Notes Heart Rate and Heartbeat Questions [1] Heart beating very rapidly (e.g., > 140 / minute) AND [2] not present now (Exception: during exercise) Final Disposition User See Physician within 4 Hours (or PCP triage) Vallery Sa, RN, Tye [place] Comments Appointment scheduled for 4pm today with Dr. Roxy Cedar.

## 2015-01-06 NOTE — Patient Instructions (Signed)

## 2015-01-06 NOTE — Progress Notes (Signed)
   Subjective:    Patient ID: Lauren Wiley, female    DOB: 1971/06/04, 44 y.o.   MRN: [id number]  HPI  44 year old African-American female, nonsmoker is in today with complaints of heart palpitations that originally started in her early 36s. However, since last summer the palpitations have become more frequent and this week, they are occurring daily lasting minutes to hours. She is able to cough and the palpitations subside. Has occasional shortness of breath associated with them. Denies any chest pain. Reports 1-2 caffeinated beverages a day. Denies any stress. Has a family history of hypothyroidism and her father. Reports mother having a heart murmur. No significant cardiovascular disease.  Review of Systems  Constitutional: Negative.   HENT: Negative.   Respiratory: Negative.   Cardiovascular: Positive for palpitations. Negative for chest pain and leg swelling.  Gastrointestinal: Negative.   Endocrine: Negative.   Genitourinary: Negative.   Musculoskeletal: Negative.   Skin: Negative.   Allergic/Immunologic: Negative.   Neurological: Negative.   Hematological: Negative.   Psychiatric/Behavioral: Negative.    History reviewed. No pertinent past medical history.  History   Social History  . Marital Status: Single    Spouse Name: N/A  . Number of Children: N/A  . Years of Education: N/A   Occupational History  . Not on file.   Social History Main Topics  . Smoking status: Former Smoker    Quit date: 11/20/1990  . Smokeless tobacco: Not on file  . Alcohol Use: No  . Drug Use: No  . Sexual Activity: Yes   Other Topics Concern  . Not on file   Social History Narrative    History reviewed. No pertinent past surgical history.  History reviewed. No pertinent family history.  No Known Allergies  Current Outpatient Prescriptions on File Prior to Visit  Medication Sig Dispense Refill  . cetirizine (ZYRTEC) 10 MG tablet Take 10 mg by mouth daily as needed for  allergies.     No current facility-administered medications on file prior to visit.    BP 100/64 mmHg  Pulse 109  Temp(Src) 98.8 F (37.1 C) (Oral)  Wt 156 lb 11.2 oz (71.079 kg)chart     Objective:   Physical Exam  Constitutional: She is oriented to person, place, and time. She appears well-developed and well-nourished.  HENT:  Right Ear: External ear normal.  Left Ear: External ear normal.  Nose: Nose normal.  Mouth/Throat: Oropharynx is clear and moist.  Neck: Normal range of motion. Neck supple.  Cardiovascular: Normal rate and normal heart sounds.   Pulmonary/Chest: Effort normal and breath sounds normal.  Abdominal: Soft. Bowel sounds are normal.  Musculoskeletal: Normal range of motion.  Neurological: She is alert and oriented to person, place, and time. She has normal reflexes.  Skin: Skin is warm and dry.  Psychiatric: She has a normal mood and affect.          Assessment & Plan:  Lauren Wiley was seen today for palpitations.  Diagnoses and all orders for this visit:  Heart palpitations Orders: -     EKG 12-Lead -     TSH -     CMP -     CBC with Differential -     T3, Free -     T4 -     Holter monitor - 48 hour; Future -     Vitamin B12  Follow-up pending labs. Refer to cardiology if necessary.  Go ED if symptoms worsen or if chest pain occurs.

## 2015-01-06 NOTE — Addendum Note (Signed)
Addended by: Elmer Picker on: 01/06/2015 05:14 PM   Modules accepted: Orders

## 2015-01-06 NOTE — Telephone Encounter (Signed)
Noted  

## 2015-01-06 NOTE — Progress Notes (Signed)
Pre visit review using our clinic review tool, if applicable. No additional management support is needed unless otherwise documented below in the visit note. 

## 2015-01-07 LAB — CBC WITH DIFFERENTIAL/PLATELET
BASOS ABS: 0 10*3/uL (ref 0.0–0.1)
BASOS PCT: 0.4 % (ref 0.0–3.0)
EOS PCT: 3.4 % (ref 0.0–5.0)
Eosinophils Absolute: 0.1 10*3/uL (ref 0.0–0.7)
HEMATOCRIT: 36.5 % (ref 36.0–46.0)
Hemoglobin: 12.1 g/dL (ref 12.0–15.0)
LYMPHS PCT: 34.2 % (ref 12.0–46.0)
Lymphs Abs: 1.3 10*3/uL (ref 0.7–4.0)
MCHC: 33.2 g/dL (ref 30.0–36.0)
MCV: 77.3 fl — AB (ref 78.0–100.0)
MONOS PCT: 11.9 % (ref 3.0–12.0)
Monocytes Absolute: 0.5 10*3/uL (ref 0.1–1.0)
NEUTROS ABS: 1.9 10*3/uL (ref 1.4–7.7)
Neutrophils Relative %: 50.1 % (ref 43.0–77.0)
Platelets: 226 10*3/uL (ref 150.0–400.0)
RBC: 4.72 Mil/uL (ref 3.87–5.11)
RDW: 14 % (ref 11.5–15.5)
WBC: 3.9 10*3/uL — ABNORMAL LOW (ref 4.0–10.5)

## 2015-01-07 LAB — T4, FREE: Free T4: 0.75 ng/dL (ref 0.60–1.60)

## 2015-01-07 LAB — COMPREHENSIVE METABOLIC PANEL
ALBUMIN: 4 g/dL (ref 3.5–5.2)
ALT: 14 U/L (ref 0–35)
AST: 19 U/L (ref 0–37)
Alkaline Phosphatase: 49 U/L (ref 39–117)
BUN: 12 mg/dL (ref 6–23)
CO2: 27 meq/L (ref 19–32)
Calcium: 9.2 mg/dL (ref 8.4–10.5)
Chloride: 106 mEq/L (ref 96–112)
Creatinine, Ser: 0.8 mg/dL (ref 0.40–1.20)
GFR: 100.54 mL/min (ref >60.00)
GLUCOSE: 88 mg/dL (ref 70–99)
POTASSIUM: 4.4 meq/L (ref 3.5–5.1)
SODIUM: 136 meq/L (ref 135–145)
TOTAL PROTEIN: 7.3 g/dL (ref 6.0–8.3)
Total Bilirubin: 0.2 mg/dL (ref 0.2–1.2)

## 2015-01-07 LAB — T3, FREE: T3, Free: 2.9 pg/mL (ref 2.3–4.2)

## 2015-01-07 LAB — TSH: TSH: 1.01 u[IU]/mL (ref 0.35–4.50)

## 2015-01-07 LAB — VITAMIN B12: VITAMIN B 12: 497 pg/mL (ref 211–911)

## 2015-01-12 ENCOUNTER — Encounter (INDEPENDENT_AMBULATORY_CARE_PROVIDER_SITE_OTHER): Payer: BLUE CROSS/BLUE SHIELD

## 2015-01-12 ENCOUNTER — Encounter: Payer: Self-pay | Admitting: Radiology

## 2015-01-12 DIAGNOSIS — R002 Palpitations: Secondary | ICD-10-CM

## 2015-01-12 NOTE — Progress Notes (Signed)
Patient ID: Lauren Wiley, female   DOB: January 03, 1971, 44 y.o.   MRN: [id number] [facility] 48hr holter applied.

## 2015-01-14 ENCOUNTER — Telehealth: Payer: Self-pay | Admitting: Internal Medicine

## 2015-01-14 NOTE — Telephone Encounter (Signed)
See results note. 

## 2015-01-14 NOTE — Telephone Encounter (Signed)
Pt saw NP on  2-17 and would like blood work results

## 2015-01-26 ENCOUNTER — Telehealth: Payer: Self-pay | Admitting: Internal Medicine

## 2015-01-26 NOTE — Telephone Encounter (Signed)
Lauren Wiley will not be back in the office until Thursday. I will call pt when results have been reviewed

## 2015-01-26 NOTE — Telephone Encounter (Signed)
Pt wore heart monitor for 48 hrs a week ago and they sent results to [facility]. Pt would like results.

## 2015-01-27 NOTE — Telephone Encounter (Signed)
Spoke with [facility] about results. Per Abby Potash, advise pt that results show she is having some PVCs but these are generally benign. She can take a beta blocker to help control them. Refer pt to cardiology for further evaluation.  Left message for pt to call back

## 2015-01-28 NOTE — Telephone Encounter (Signed)
Please enter referral in system. Thanks.

## 2015-01-28 NOTE — Telephone Encounter (Signed)
Spoke to patient and read previous note about PVCs and referring out to cardiology. Patient stated she understood and is awaiting call back about cardiology appointment.

## 2015-01-29 ENCOUNTER — Other Ambulatory Visit: Payer: Self-pay | Admitting: Family

## 2015-01-29 DIAGNOSIS — R002 Palpitations: Secondary | ICD-10-CM

## 2015-02-08 ENCOUNTER — Ambulatory Visit: Payer: BLUE CROSS/BLUE SHIELD | Admitting: Cardiology

## 2015-03-10 ENCOUNTER — Encounter: Payer: Self-pay | Admitting: Cardiology

## 2015-03-10 ENCOUNTER — Ambulatory Visit (INDEPENDENT_AMBULATORY_CARE_PROVIDER_SITE_OTHER): Payer: BLUE CROSS/BLUE SHIELD | Admitting: Cardiology

## 2015-03-10 VITALS — BP 100/66 | HR 77 | Ht 65.5 in | Wt 157.2 lb

## 2015-03-10 DIAGNOSIS — I491 Atrial premature depolarization: Secondary | ICD-10-CM

## 2015-03-10 DIAGNOSIS — R002 Palpitations: Secondary | ICD-10-CM

## 2015-03-10 NOTE — Progress Notes (Signed)
Patient ID: Lauren Wiley, female   DOB: 02-13-1971, 44 y.o.   MRN: [id number]      Cardiology Office Note  Date:  03/10/2015   ID:  Lauren Wiley, DOB 10/05/1971, MRN [id number]  PCP:  Georgetta Haber, MD  Cardiologist: Dorothy Spark, MD   Chief Complaint  Patient presents with  . Appointment    SSR  Palpitations  History of Present Illness: Lauren Wiley is a 44 y.o. female who is referred to [place] by her PCP for palpitations. The patient feels occasional palpitations and underwent 48 Hour Holter monitoring. It showed rare PVCs and frequent unifocal PACs- 4000 in 24 hours. The patient states that she never feels associated SOB, chest pain, dizziness, no prior h/o syncope. No FH of SCD, MI or CHF. Her mother has a-fib.  She is very active, exercising on a regular basis and has no palpitations during exercise. No limitations while exercising.   History reviewed. No pertinent past medical history.  History reviewed. No pertinent past surgical history.   No current outpatient prescriptions on file.   No current facility-administered medications for this visit.    Allergies:   Review of patient's allergies indicates no known allergies.    Social History:  The patient  reports that she quit smoking about 24 years ago. She does not have any smokeless tobacco history on file. She reports that she does not drink alcohol or use illicit drugs.   Family History:  The patient's family history includes Arrhythmia in her maternal grandmother and mother; Heart attack in her paternal grandfather, paternal grandmother, and paternal uncle; Hypertension in her father. There is no history of Heart failure.    ROS:  Please see the history of present illness.    All other systems are reviewed and negative.    PHYSICAL EXAM: VS:  BP 100/66 mmHg  Pulse 77  Ht 5' 5.5" (1.664 m)  Wt 157 lb 3.2 oz (71.305 kg)  BMI 25.75 kg/m2 , BMI Body mass index is 25.75 kg/(m^2). GEN: Well  nourished, well developed, in no acute distress HEENT: normal Neck: no JVD, carotid bruits, or masses Cardiac: RRR; no murmurs, rubs, or gallops,no edema  Respiratory:  clear to auscultation bilaterally, normal work of breathing GI: soft, nontender, nondistended, + BS MS: no deformity or atrophy Skin: warm and dry, no rash Neuro:  Strength and sensation are intact Psych: euthymic mood, full affect   EKG:  SR, normal ECG   Recent Labs: 01/06/2015: ALT 14; BUN 12; Creatinine 0.80; Hemoglobin 12.1; Platelets 226.0; Potassium 4.4; Sodium 136; TSH 1.01    Lipid Panel    Component Value Date/Time   CHOL 152 02/24/2014 1022   TRIG 28.0 02/24/2014 1022   HDL 60.10 02/24/2014 1022   CHOLHDL 3 02/24/2014 1022   VLDL 5.6 02/24/2014 1022   LDLCALC 86 02/24/2014 1022      Wt Readings from Last 3 Encounters:  03/10/15 157 lb 3.2 oz (71.305 kg)  01/06/15 156 lb 11.2 oz (71.079 kg)  09/28/14 160 lb (72.576 kg)      Other studies Reviewed: Additional studies/ records that were reviewed today include: Holter monitor. Review of the above records demonstrates: as in HPI   ASSESSMENT AND PLAN:  1. Frequent unifocal PACs -   The patient is being reassured that those are benign. Her symptoms don't bother her, she was just seeking reassurance. She would rather hold off on echocardiogram. She is informed to call us if her symptoms worsen.  Potential treatment  options are betablockers and ablation, none of those are advised at this point. She agrees with the plan.   No orders of the defined types were placed in this encounter.   Disposition:  F/U as needed.  Signed, Dorothy Spark, MD  03/10/2015 9:20 AM    [facility] [address], [place], [place]  [address] Phone: [phone]; Fax: [phone]

## 2015-03-10 NOTE — Patient Instructions (Signed)
Your physician recommends that you continue on your current medications as directed. Please refer to the Current Medication list given to you today.    Your physician recommends that you schedule a follow-up appointment in: AS NEEDED WITH DR NELSON  

## 2015-04-02 ENCOUNTER — Encounter: Payer: Self-pay | Admitting: Family Medicine

## 2015-04-02 ENCOUNTER — Telehealth: Payer: Self-pay | Admitting: Family Medicine

## 2015-04-02 ENCOUNTER — Ambulatory Visit (INDEPENDENT_AMBULATORY_CARE_PROVIDER_SITE_OTHER): Payer: BLUE CROSS/BLUE SHIELD | Admitting: Family Medicine

## 2015-04-02 VITALS — BP 97/63 | HR 88 | Temp 99.3°F | Ht 65.5 in | Wt 157.0 lb

## 2015-04-02 DIAGNOSIS — J0100 Acute maxillary sinusitis, unspecified: Secondary | ICD-10-CM

## 2015-04-02 MED ORDER — AMOXICILLIN-POT CLAVULANATE 875-125 MG PO TABS: 1.0000 | ORAL_TABLET | Freq: Two times a day (BID) | ORAL | Status: DC

## 2015-04-02 NOTE — Telephone Encounter (Signed)
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 Pt is aware Dr. Sarajane Jews is out of the office and will return on Monday. Pt would like to see what he advises on Monday 5.16.2016.

## 2015-04-02 NOTE — Progress Notes (Signed)
Pre visit review using our clinic review tool, if applicable. No additional management support is needed unless otherwise documented below in the visit note. 

## 2015-04-02 NOTE — Telephone Encounter (Signed)
[facility] - Client [place]  Patient Name: Lauren Wiley  DOB: Jul 16, 1971    Initial Comment Caller states saw Dr Sarajane Jews this morning; dx w/severe allergies; thinks he ordered augmentin; was given amoxicillin and clavulinate potassium; office said ok it was generic brand; now throat is sore and tongue feels thick; NOT having difficulty breathing; took about 12pm and took nap;    Nurse Assessment  Nurse: Mechele Dawley, RN, Amy Date/Time ([place] Time): 04/02/2015 2:30:36 PM  Confirm and document reason for call. If symptomatic, describe symptoms. ---CALLER STATES THAT SHE WAS SEEN THIS MORNING AND WAS GIVEN AUGMENTIN. SHE TOOK IT AT 2. SORE THROAT, TONGUE SWOLLEN. SHE STATES IF SHE PUSHES UNDER HER CHIN SHE IS SORE THERE AS WELL. SHE STATES THAT SHE DID NOT HAVE A SORE THROAT AND HER TONGUE WAS FINE. SHE STATES SHE HAD LUNCH AT [place] [employer] BEFORE SHE TOOK THE MEDICATION. SHE STATES SHE ATE EVERYTHING THAT SHE HAS EATEN BEFORE. NO DIFFICULT BREATHING AND NO RASH.  Has the patient traveled out of the country within the last 30 days? ---Not Applicable  Does the patient require triage? ---Yes  Related visit to physician within the last 2 weeks? ---Yes  Does the PT have any chronic conditions? (i.e. diabetes, asthma, etc.) ---No  Did the patient indicate they were pregnant? ---No     Guidelines    Guideline Title Affirmed Question Affirmed Notes  PCP Call - No Triage Call about patient who is currently hospitalized    Final Disposition User   Call PCP Now [place], RN, Amy    Comments  CALLED THE OFFICE AND LEFT MESSAGE FOR THE OFFICE THAT WILL BE SENDING MESSAGE TO THE NURSE AS THAT IS WHAT TRIAGE NURSE WAS INSTRUCTED TO DO.

## 2015-04-02 NOTE — Telephone Encounter (Signed)
Per Dr. Regis Bill pt should STOP Augmentin and be seen at [facility] 5[facility]14[facility]2016.  Pt should take Benadryl and go to the ER if symptoms worsen: difficulty breathing, rash etc.Per Dr. Regis Bill it is not safe to prescribe another abx over the phone.  Called and spoke with pt and pt states she did take a Benadryl about 2 hours ago and it is sore at the back of her tongue and throat area.  Advised pt to take another benadryl tonight and tomorrow if needed.  Also advised that Triage is available 24 hours over the weekend.  Pt verbalized understanding of information given and will seek medical attention if needed.

## 2015-04-02 NOTE — Progress Notes (Signed)
   Subjective:    Patient ID: Lauren Wiley, female    DOB: August 08, 1971, 44 y.o.   MRN: [id number]  HPI Here for one week of sinus pressure PND, and coughing up green sputum. Low grade fevers. Using Mucinex DM.    Review of Systems  Constitutional: Positive for fever.  HENT: Positive for congestion, postnasal drip and sinus pressure.   Respiratory: Positive for cough.        Objective:   Physical Exam  Constitutional: She appears well-developed and well-nourished.  HENT:  Right Ear: External ear normal.  Left Ear: External ear normal.  Nose: Nose normal.  Mouth/Throat: Oropharynx is clear and moist.  Eyes: Conjunctivae are normal.  Pulmonary/Chest: Effort normal and breath sounds normal.  Lymphadenopathy:    She has no cervical adenopathy.          Assessment & Plan:  Sinusitis, add Augmentin.

## 2015-04-05 ENCOUNTER — Encounter: Payer: Self-pay | Admitting: Family Medicine

## 2015-04-05 ENCOUNTER — Ambulatory Visit (INDEPENDENT_AMBULATORY_CARE_PROVIDER_SITE_OTHER): Payer: BLUE CROSS/BLUE SHIELD | Admitting: Family Medicine

## 2015-04-05 VITALS — BP 102/70 | HR 89 | Temp 98.6°F | Ht 65.5 in | Wt 156.0 lb

## 2015-04-05 DIAGNOSIS — J0100 Acute maxillary sinusitis, unspecified: Secondary | ICD-10-CM

## 2015-04-05 MED ORDER — AZITHROMYCIN 250 MG PO TABS: ORAL_TABLET | ORAL | Status: DC

## 2015-04-05 NOTE — Telephone Encounter (Signed)
Pt is on Dr. Barbie Banner schedule for this morning.

## 2015-04-05 NOTE — Progress Notes (Signed)
Pre visit review using our clinic review tool, if applicable. No additional management support is needed unless otherwise documented below in the visit note. 

## 2015-04-05 NOTE — Progress Notes (Signed)
   Subjective:    Patient ID: Lauren Wiley, female    DOB: Nov 02, 1971, 44 y.o.   MRN: [id number]  HPI Here for continued symptoms of sinus congestion, PND, and a dry cough. She was given Augmentin here last week but she developed a rash and tongue swelling so she stopped this. She took Benadryl and these reactions quickly subsided. She has been using Delsym for the cough and this helps a lot.    Review of Systems  Constitutional: Negative.   HENT: Positive for congestion, postnasal drip and sinus pressure.   Eyes: Negative.   Respiratory: Positive for cough.        Objective:   Physical Exam  Constitutional: She appears well-developed and well-nourished.  HENT:  Right Ear: External ear normal.  Left Ear: External ear normal.  Nose: Nose normal.  Mouth/Throat: Oropharynx is clear and moist.  Eyes: Conjunctivae are normal.  Pulmonary/Chest: Effort normal and breath sounds normal.  Lymphadenopathy:    She has no cervical adenopathy.          Assessment & Plan:  The allergic reaction has resolved. Treat the sinusitis with a Zpack.

## 2015-05-18 ENCOUNTER — Ambulatory Visit (INDEPENDENT_AMBULATORY_CARE_PROVIDER_SITE_OTHER): Payer: BLUE CROSS/BLUE SHIELD | Admitting: Adult Health

## 2015-05-18 ENCOUNTER — Encounter: Payer: Self-pay | Admitting: Adult Health

## 2015-05-18 VITALS — BP 102/68 | Temp 98.3°F | Ht 65.5 in | Wt 158.7 lb

## 2015-05-18 DIAGNOSIS — Z7189 Other specified counseling: Secondary | ICD-10-CM

## 2015-05-18 DIAGNOSIS — Z7689 Persons encountering health services in other specified circumstances: Secondary | ICD-10-CM

## 2015-05-18 NOTE — Progress Notes (Signed)
HPI:  Lauren Wiley is here to establish care. She is a very pleasant AA female who is very healthy and only has a past medical history of seasonal allergies.   Last PCP and physical: 4/15 with NP Justin Mend  Has the following chronic problems that require follow up and concerns today:   ROS negative for unless reported above: fevers, chills,feeling poorly, unintentional weight loss, hearing or vision loss, chest pain, palpitations, leg claudication, struggling to breath,Not feeling congested in the chest, no orthopenia, no cough,no wheezing, normal appetite, no soft tissue swelling, no hemoptysis, melena, hematochezia, hematuria, falls, loc, si, or thoughts of self harm.  Immunizations:UTD Diet:High fiber, lean protein, fruits and vegetables.  [place] training and palates - 3 -4 times a week Pap Smear: 02/2015 - is followed by Dr. Garwin Brothers for GYN care Mammogram:01/2015  No past medical history on file.  No past surgical history on file.  Family History  Problem Relation Age of Onset  . Arrhythmia Mother   . Hypertension Father   . Heart attack Paternal Uncle   . Arrhythmia Maternal Grandmother   . Heart attack Paternal Grandmother   . Heart attack Paternal Grandfather   . Heart failure Neg Hx     History   Social History  . Marital Status: Single    Spouse Name: N/A  . Number of Children: N/A  . Years of Education: N/A   Social History Main Topics  . Smoking status: Former Smoker    Quit date: 11/20/1990  . Smokeless tobacco: Never Used  . Alcohol Use: No  . Drug Use: No  . Sexual Activity: Yes   Other Topics Concern  . None   Social History Narrative    No current outpatient prescriptions on file.  EXAM:  Filed Vitals:   05/18/15 1418  BP: 102/68  Temp: 98.3 F (36.8 C)    Body mass index is 26 kg/(m^2).  GENERAL: vitals reviewed and listed above, alert, oriented, appears well hydrated and in no acute distress  HEENT: atraumatic,  conjunttiva clear, no obvious abnormalities on inspection of external nose and ears  NECK: Neck is soft and supple without masses, no adenopathy or thyromegaly, trachea midline, no JVD. Normal range of motion.   LUNGS: clear to auscultation bilaterally, no wheezes, rales or rhonchi, good air movement  CV: Regular rate and rhythm, normal S1/S2, no audible murmurs, gallops, or rubs. No carotid bruit and no peripheral edema.   MS: moves all extremities without noticeable abnormality. No edema noted  Abd: soft/nontender/nondistended/normal bowel sounds   Skin: warm and dry, no rash   Extremities: No clubbing, cyanosis, or edema. Capillary refill is WNL. Pulses intact bilaterally in upper and lower extremities.   Neuro: CN II-XII intact, sensation and reflexes normal throughout, 5/5 muscle strength in bilateral upper and lower extremities. Normal finger to nose. Normal rapid alternating movements. Normal romberg. No pronator drift.   PSYCH: pleasant and cooperative, no obvious depression or anxiety  ASSESSMENT AND PLAN:  1. Encounter to establish care - Exam unremarkable.  - Follow up for CPE or sooner if needed - Continue to exercise and eat a heart healthy diet.     Discussed the following assessment and plan:  No diagnosis found. -We reviewed the PMH, PSH, FH, SH, Meds and Allergies. -We provided refills for any medications we will prescribe as needed. -We addressed current concerns per orders and patient instructions. -We have asked for records for pertinent exams, studies, vaccines and notes from previous providers. -We  have advised patient to follow up per instructions below.   -Patient advised to return or notify a provider immediately if symptoms worsen or persist or new concerns arise.  There are no Patient Instructions on file for this visit.   Dorothyann Peng, AGNP

## 2015-05-18 NOTE — Patient Instructions (Signed)
It was so nice meeting you today! Please follow up with me when it is convenient for you so that we can do a physical and get blood work done.   If you need anything in the mean time, please let me know.   Best of luck with the adoption process.

## 2015-05-18 NOTE — Progress Notes (Signed)
Pre visit review using our clinic review tool, if applicable. No additional management support is needed unless otherwise documented below in the visit note. 

## 2015-07-02 ENCOUNTER — Telehealth: Payer: Self-pay | Admitting: Adult Health

## 2015-07-02 NOTE — Telephone Encounter (Signed)
Error

## 2016-01-19 ENCOUNTER — Telehealth: Payer: Self-pay | Admitting: Adult Health

## 2016-01-19 NOTE — Telephone Encounter (Signed)
I am sorry but I cannot accomodate her. She should schedule for my next physical appointment.

## 2016-01-19 NOTE — Telephone Encounter (Signed)
Pt needs cpx by end of month. Can I create 30 min slot for physical ?

## 2016-01-21 NOTE — Telephone Encounter (Signed)
Spoke with pt and advised her of decision

## 2016-01-21 NOTE — Telephone Encounter (Signed)
lmom for pt to call back

## 2016-03-29 ENCOUNTER — Other Ambulatory Visit: Payer: BLUE CROSS/BLUE SHIELD

## 2016-04-05 ENCOUNTER — Encounter: Payer: BLUE CROSS/BLUE SHIELD | Admitting: Adult Health

## 2016-04-20 ENCOUNTER — Other Ambulatory Visit (INDEPENDENT_AMBULATORY_CARE_PROVIDER_SITE_OTHER): Payer: BLUE CROSS/BLUE SHIELD

## 2016-04-20 DIAGNOSIS — Z0000 Encounter for general adult medical examination without abnormal findings: Secondary | ICD-10-CM | POA: Diagnosis not present

## 2016-04-20 LAB — HEPATIC FUNCTION PANEL
ALBUMIN: 4.2 g/dL (ref 3.5–5.2)
ALT: 10 U/L (ref 0–35)
AST: 13 U/L (ref 0–37)
Alkaline Phosphatase: 43 U/L (ref 39–117)
Bilirubin, Direct: 0.1 mg/dL (ref 0.0–0.3)
TOTAL PROTEIN: 7 g/dL (ref 6.0–8.3)
Total Bilirubin: 0.3 mg/dL (ref 0.2–1.2)

## 2016-04-20 LAB — POC URINALSYSI DIPSTICK (AUTOMATED)
Bilirubin, UA: NEGATIVE
Blood, UA: NEGATIVE
Glucose, UA: NEGATIVE
KETONES UA: NEGATIVE
Nitrite, UA: NEGATIVE
PH UA: 7
PROTEIN UA: NEGATIVE
SPEC GRAV UA: 1.02
UROBILINOGEN UA: 0.2

## 2016-04-20 LAB — CBC WITH DIFFERENTIAL/PLATELET
BASOS ABS: 0 10*3/uL (ref 0.0–0.1)
BASOS PCT: 0.7 % (ref 0.0–3.0)
Eosinophils Absolute: 0.1 10*3/uL (ref 0.0–0.7)
Eosinophils Relative: 1.6 % (ref 0.0–5.0)
HEMATOCRIT: 34.5 % — AB (ref 36.0–46.0)
Hemoglobin: 11.1 g/dL — ABNORMAL LOW (ref 12.0–15.0)
LYMPHS ABS: 1.2 10*3/uL (ref 0.7–4.0)
LYMPHS PCT: 38.3 % (ref 12.0–46.0)
MCHC: 32.3 g/dL (ref 30.0–36.0)
MCV: 72.7 fl — AB (ref 78.0–100.0)
MONOS PCT: 11 % (ref 3.0–12.0)
Monocytes Absolute: 0.3 10*3/uL (ref 0.1–1.0)
NEUTROS ABS: 1.5 10*3/uL (ref 1.4–7.7)
NEUTROS PCT: 48.4 % (ref 43.0–77.0)
PLATELETS: 204 10*3/uL (ref 150.0–400.0)
RBC: 4.75 Mil/uL (ref 3.87–5.11)
RDW: 15.4 % (ref 11.5–15.5)
WBC: 3.1 10*3/uL — ABNORMAL LOW (ref 4.0–10.5)

## 2016-04-20 LAB — LIPID PANEL
CHOLESTEROL: 128 mg/dL (ref 0–200)
HDL: 53.8 mg/dL (ref >39.00)
LDL Cholesterol: 68 mg/dL (ref 0–99)
NonHDL: 74.05
TRIGLYCERIDES: 29 mg/dL (ref 0.0–149.0)
Total CHOL/HDL Ratio: 2
VLDL: 5.8 mg/dL (ref 0.0–40.0)

## 2016-04-20 LAB — BASIC METABOLIC PANEL
BUN: 10 mg/dL (ref 6–23)
CALCIUM: 8.9 mg/dL (ref 8.4–10.5)
CHLORIDE: 105 meq/L (ref 96–112)
CO2: 27 meq/L (ref 19–32)
CREATININE: 0.63 mg/dL (ref 0.40–1.20)
GFR: 131.67 mL/min (ref >60.00)
GLUCOSE: 91 mg/dL (ref 70–99)
Potassium: 4.3 mEq/L (ref 3.5–5.1)
SODIUM: 137 meq/L (ref 135–145)

## 2016-04-20 LAB — TSH: TSH: 1.07 u[IU]/mL (ref 0.35–4.50)

## 2016-04-25 ENCOUNTER — Encounter: Payer: Self-pay | Admitting: Adult Health

## 2016-04-25 ENCOUNTER — Ambulatory Visit (INDEPENDENT_AMBULATORY_CARE_PROVIDER_SITE_OTHER): Payer: BLUE CROSS/BLUE SHIELD | Admitting: Adult Health

## 2016-04-25 VITALS — BP 122/62 | Temp 98.1°F | Wt 158.0 lb

## 2016-04-25 DIAGNOSIS — Z0000 Encounter for general adult medical examination without abnormal findings: Secondary | ICD-10-CM | POA: Diagnosis not present

## 2016-04-25 NOTE — Progress Notes (Signed)
Subjective:    Patient ID: Lauren Wiley, female    DOB: 12-23-1970, 45 y.o.   MRN: [id number]  HPI  Patient presents for yearly preventative medicine examination. She is a pleasant, active, and healthy 45 year old female who  has a past medical history of Seasonal allergies.  All immunizations and health maintenance protocols were reviewed with the patient and needed orders were placed.  Medication reconciliation,  past medical history, social history, problem list and allergies were reviewed in detail with the patient  Goals were established with regard to weight loss, exercise, and  diet in compliance with medications  Her vaccinations are up to date  She last had a pap and mammogram in 02/2016- both were normal   She continues to eat healthy and exercise as much as possible.   Her and her husband have recently adopted a baby boy.   She has no acute complaints.   Review of Systems  Constitutional: Negative.   HENT: Negative.   Eyes: Negative.   Respiratory: Negative.   Cardiovascular: Negative.   Gastrointestinal: Negative.   Endocrine: Negative.   Genitourinary: Negative.   Musculoskeletal: Negative.   Skin: Negative.   Allergic/Immunologic: Negative.   Neurological: Negative.   Hematological: Negative.   Psychiatric/Behavioral: Negative.    Past Medical History  Diagnosis Date  . Seasonal allergies     Social History   Social History  . Marital Status: Single    Spouse Name: N/A  . Number of Children: N/A  . Years of Education: N/A   Occupational History  . Not on file.   Social History Main Topics  . Smoking status: Former Smoker    Quit date: 11/20/1990  . Smokeless tobacco: Never Used  . Alcohol Use: No  . Drug Use: No  . Sexual Activity: Yes   Other Topics Concern  . 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     No past surgical  history on file.  Family History  Problem Relation Age of Onset  . Arrhythmia Mother   . Hypotension Father   . Heart attack Paternal Uncle   . Arrhythmia Maternal Grandmother   . Heart attack Paternal Grandmother   . Heart attack Paternal Grandfather   . Heart failure Neg Hx   . Diabetes Mellitus II Mother   . Diabetes Mellitus II Father   . Hypertension Mother   . Diabetes Mellitus II Maternal Grandmother     Allergies  Allergen Reactions  . Augmentin [Amoxicillin-Pot Clavulanate] Other (See Comments)    Throat swelling and soreness     No current outpatient prescriptions on file prior to visit.   No current facility-administered medications on file prior to visit.    BP 122/62 mmHg  Temp(Src) 98.1 F (36.7 C) (Oral)  Wt 158 lb (71.668 kg)  LMP 03/28/2016       Objective:   Physical Exam  Constitutional: She is oriented to person, place, and time. She appears well-developed and well-nourished. No distress.  HENT:  Head: Normocephalic and atraumatic.  Right Ear: External ear normal.  Left Ear: External ear normal.  Nose: Nose normal.  Mouth/Throat: Oropharynx is clear and moist. No oropharyngeal exudate.  Eyes: Conjunctivae and EOM are normal. Pupils are equal, round, and reactive to light. Right eye exhibits no discharge. Left eye exhibits no discharge. No scleral icterus.  Neck: Normal range of motion. Neck supple. No JVD present. No tracheal tenderness present. Carotid bruit is not present. No tracheal deviation present. No thyroid mass and no thyromegaly present.  Cardiovascular: Normal rate, regular rhythm, normal heart sounds and intact distal pulses.  Exam reveals no gallop and no friction rub.   No murmur heard. Pulmonary/Chest: Effort normal and breath sounds normal. No stridor. No respiratory distress. She has no wheezes. She has no rales. She exhibits no tenderness.  Abdominal: Soft. Bowel sounds are normal. She exhibits no distension and no mass. There  is no tenderness. There is no rebound and no guarding.  Genitourinary:  Deferred: Has been done by GYN  Musculoskeletal: Normal range of motion. She exhibits no edema or tenderness.  Lymphadenopathy:    She has no cervical adenopathy.  Neurological: She is alert and oriented to person, place, and time. She has normal reflexes. She displays normal reflexes. No cranial nerve deficit. She exhibits normal muscle tone. Coordination normal.  Skin: Skin is warm and dry. No rash noted. She is not diaphoretic. No erythema. No pallor.  Psychiatric: She has a normal mood and affect. Her behavior is normal. Judgment and thought content normal.  Nursing note and vitals reviewed.      Assessment & Plan:  1. Routine general medical examination at a health care facility - Reviewed labs in detail. She was slightly anemic but reported being on her period at that time.  - Otherwise benign exam  - Continue to eat healthy and exercise - Follow up in one year or sooner if needed.   Dorothyann Peng, NP

## 2016-05-19 DIAGNOSIS — L810 Postinflammatory hyperpigmentation: Secondary | ICD-10-CM | POA: Diagnosis not present

## 2016-05-19 DIAGNOSIS — L719 Rosacea, unspecified: Secondary | ICD-10-CM | POA: Diagnosis not present

## 2016-05-19 DIAGNOSIS — L700 Acne vulgaris: Secondary | ICD-10-CM | POA: Diagnosis not present

## 2016-12-15 ENCOUNTER — Encounter: Payer: Self-pay | Admitting: Adult Health

## 2016-12-15 ENCOUNTER — Ambulatory Visit (INDEPENDENT_AMBULATORY_CARE_PROVIDER_SITE_OTHER): Payer: BLUE CROSS/BLUE SHIELD | Admitting: Adult Health

## 2016-12-15 VITALS — BP 124/68 | HR 96 | Temp 98.0°F | Ht 65.5 in | Wt 155.2 lb

## 2016-12-15 DIAGNOSIS — J0101 Acute recurrent maxillary sinusitis: Secondary | ICD-10-CM

## 2016-12-15 LAB — POCT INFLUENZA A/B
INFLUENZA A, POC: NEGATIVE
INFLUENZA B, POC: NEGATIVE

## 2016-12-15 MED ORDER — DOXYCYCLINE HYCLATE 100 MG PO CAPS: 100.0000 mg | ORAL_CAPSULE | Freq: Two times a day (BID) | ORAL | 0 refills | Status: DC

## 2016-12-15 NOTE — Patient Instructions (Signed)
Flu swab was negative  Your exam is consistent with a sinus infection.   I have sent in a prescription for doxycycline. Take this as directed   General Recommendations:    Please drink plenty of fluids.  Get plenty of rest   Sleep in humidified air  Use saline nasal sprays  Netti pot   OTC Medications:  Decongestants - helps relieve congestion   Flonase (generic fluticasone) or Nasacort (generic triamcinolone) - please make sure to use the "cross-over" technique at a 45 degree angle towards the opposite eye as opposed to straight up the nasal passageway.   Sudafed (generic pseudoephedrine - Note this is the one that is available behind the pharmacy counter); Products with phenylephrine (-PE) may also be used but is often not as effective as pseudoephedrine.   If you have HIGH BLOOD PRESSURE - Coricidin HBP; AVOID any product that is -D as this contains pseudoephedrine which may increase your blood pressure.  Afrin (oxymetazoline) every 6-8 hours for up to 3 days.   Allergies - helps relieve runny nose, itchy eyes and sneezing   Claritin (generic loratidine), Allegra (fexofenidine), or Zyrtec (generic cyrterizine) for runny nose. These medications should not cause drowsiness.  Note - Benadryl (generic diphenhydramine) may be used however may cause drowsiness  Cough -   Delsym or Robitussin (generic dextromethorphan)  Expectorants - helps loosen mucus to ease removal   Mucinex (generic guaifenesin) as directed on the package.  Headaches / General Aches   Tylenol (generic acetaminophen) - DO NOT EXCEED 3 grams (3,000 mg) in a 24 hour time period  Advil/Motrin (generic ibuprofen)   Sore Throat -   Salt water gargle   Chloraseptic (generic benzocaine) spray or lozenges / Sucrets (generic dyclonine)    Sinusitis Sinusitis is redness, soreness, and inflammation of the paranasal sinuses. Paranasal sinuses are air pockets within the bones of your face (beneath  the eyes, the middle of the forehead, or above the eyes). In healthy paranasal sinuses, mucus is able to drain out, and air is able to circulate through them by way of your nose. However, when your paranasal sinuses are inflamed, mucus and air can become trapped. This can allow bacteria and other germs to grow and cause infection. Sinusitis can develop quickly and last only a short time (acute) or continue over a long period (chronic). Sinusitis that lasts for more than 12 weeks is considered chronic.  CAUSES  Causes of sinusitis include:  Allergies.  Structural abnormalities, such as displacement of the cartilage that separates your nostrils (deviated septum), which can decrease the air flow through your nose and sinuses and affect sinus drainage.  Functional abnormalities, such as when the small hairs (cilia) that line your sinuses and help remove mucus do not work properly or are not present. SIGNS AND SYMPTOMS  Symptoms of acute and chronic sinusitis are the same. The primary symptoms are pain and pressure around the affected sinuses. Other symptoms include:  Upper toothache.  Earache.  Headache.  Bad breath.  Decreased sense of smell and taste.  A cough, which worsens when you are lying flat.  Fatigue.  Fever.  Thick drainage from your nose, which often is green and may contain pus (purulent).  Swelling and warmth over the affected sinuses. DIAGNOSIS  Your health care provider will perform a physical exam. During the exam, your health care provider may:  Look in your nose for signs of abnormal growths in your nostrils (nasal polyps).  Tap over the affected sinus  to check for signs of infection.  View the inside of your sinuses (endoscopy) using an imaging device that has a light attached (endoscope). If your health care provider suspects that you have chronic sinusitis, one or more of the following tests may be recommended:  Allergy tests.  Nasal culture. A sample of  mucus is taken from your nose, sent to a lab, and screened for bacteria.  Nasal cytology. A sample of mucus is taken from your nose and examined by your health care provider to determine if your sinusitis is related to an allergy. TREATMENT  Most cases of acute sinusitis are related to a viral infection and will resolve on their own within 10 days. Sometimes medicines are prescribed to help relieve symptoms (pain medicine, decongestants, nasal steroid sprays, or saline sprays).  However, for sinusitis related to a bacterial infection, your health care provider will prescribe antibiotic medicines. These are medicines that will help kill the bacteria causing the infection.  Rarely, sinusitis is caused by a fungal infection. In theses cases, your health care provider will prescribe antifungal medicine. For some cases of chronic sinusitis, surgery is needed. Generally, these are cases in which sinusitis recurs more than 3 times per year, despite other treatments. HOME CARE INSTRUCTIONS   Drink plenty of water. Water helps thin the mucus so your sinuses can drain more easily.  Use a humidifier.  Inhale steam 3 to 4 times a day (for example, sit in the bathroom with the shower running).  Apply a warm, moist washcloth to your face 3 to 4 times a day, or as directed by your health care provider.  Use saline nasal sprays to help moisten and clean your sinuses.  Take medicines only as directed by your health care provider.  If you were prescribed either an antibiotic or antifungal medicine, finish it all even if you start to feel better. SEEK IMMEDIATE MEDICAL CARE IF:  You have increasing pain or severe headaches.  You have nausea, vomiting, or drowsiness.  You have swelling around your face.  You have vision problems.  You have a stiff neck.  You have difficulty breathing. MAKE SURE YOU:   Understand these instructions.  Will watch your condition.  Will get help right away if you  are not doing well or get worse. Document Released: 11/06/2005 Document Revised: 03/23/2014 Document Reviewed: 11/21/2011 [facility] Patient Information 2015 [place], [place]. This information is not intended to replace advice given to you by your health care provider. Make sure you discuss any questions you have with your health care provider.

## 2016-12-15 NOTE — Progress Notes (Signed)
Subjective:    Patient ID: Lauren Wiley, female    DOB: 1971-09-08, 46 y.o.   MRN: [id number]  HPI  46 year old female who  has a past medical history of Seasonal allergies. She presents to the office today for flu like symptoms. Her symptoms include that of fevers, chills, fatigue, rhinorrhea, sore throat, non productive cough, sinus pain and pressure. Her symptoms have been present for 2 days.    She denies any n/v/d  She has been using Mucinex Cold and Flu, which has helped  Review of Systems  Constitutional: Positive for activity change, chills, diaphoresis, fatigue and fever.  HENT: Positive for congestion, rhinorrhea, sinus pain, sinus pressure and sore throat. Negative for ear discharge, ear pain and trouble swallowing.   Eyes: Negative.   Respiratory: Positive for cough, shortness of breath and wheezing.   Cardiovascular: Negative.   Gastrointestinal: Negative.   Genitourinary: Negative.   Neurological: Negative.   All other systems reviewed and are negative.  Past Medical History:  Diagnosis Date  . Seasonal allergies     Social History   Social History  . Marital status: Single    Spouse name: N/A  . Number of children: N/A  . Years of education: N/A   Occupational History  . Not on file.   Social History Main Topics  . Smoking status: Former Smoker    Quit date: 11/20/1990  . Smokeless tobacco: Never Used  . Alcohol use No  . Drug use: No  . Sexual activity: Yes   Other Topics Concern  . 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     No past surgical history on file.  Family History  Problem Relation Age of Onset  . Arrhythmia Mother   . Hypotension Father   . Heart attack Paternal Uncle   . Arrhythmia Maternal Grandmother   . Heart attack Paternal Grandmother   . Heart attack Paternal Grandfather   . Heart failure Neg Hx   . Diabetes  Mellitus II Mother   . Diabetes Mellitus II Father   . Hypertension Mother   . Diabetes Mellitus II Maternal Grandmother     Allergies  Allergen Reactions  . Augmentin [Amoxicillin-Pot Clavulanate] Other (See Comments)    Throat swelling and soreness     No current outpatient prescriptions on file prior to visit.   No current facility-administered medications on file prior to visit.     BP 124/68   Pulse 96   Temp 98 F (36.7 C) (Oral)   Ht 5' 5.5" (1.664 m)   Wt 155 lb 3.2 oz (70.4 kg)   SpO2 97%   BMI 25.43 kg/m       Objective:   Physical Exam  Constitutional: She is oriented to person, place, and time. She appears well-developed and well-nourished. No distress.  HENT:  Head: Normocephalic and atraumatic.  Right Ear: Hearing, tympanic membrane, external ear and ear canal normal.  Left Ear: Hearing, tympanic membrane, external ear and ear canal normal.  Nose: Mucosal edema and rhinorrhea present. Right sinus exhibits no frontal sinus tenderness. Left sinus exhibits maxillary sinus tenderness. Left sinus exhibits no frontal sinus tenderness.  Mouth/Throat: Uvula is midline and oropharynx is clear and moist. No oropharyngeal exudate.  Eyes: Conjunctivae and EOM are normal. Pupils are equal, round, and reactive to light. Left eye exhibits no  discharge.  Neck: No thyromegaly present.  Cardiovascular: Normal rate, regular rhythm, normal heart sounds and intact distal pulses.  Exam reveals no gallop and no friction rub.   No murmur heard. Pulmonary/Chest: Effort normal and breath sounds normal. No respiratory distress. She has no wheezes. She has no rales. She exhibits no tenderness.  Lymphadenopathy:    She has no cervical adenopathy.  Neurological: She is alert and oriented to person, place, and time.  Skin: Skin is warm and dry. No rash noted. She is not diaphoretic. No erythema.  Psychiatric: She has a normal mood and affect. Her behavior is normal. Judgment and thought  content normal.  Nursing note and vitals reviewed.     Assessment & Plan:  1. Acute recurrent maxillary sinusitis - doxycycline (VIBRAMYCIN) 100 MG capsule; Take 1 capsule (100 mg total) by mouth 2 (two) times daily.  Dispense: 14 capsule; Refill: 0 - POC Influenza A/B- Negative  - Her symptoms are more consistent with sinusitis.  - Advised rest, hydration, and flonase - Tylenol for symptom relief - Follow up in 2-3 days if no improvement  Dorothyann Peng, NP

## 2017-03-14 DIAGNOSIS — Z1231 Encounter for screening mammogram for malignant neoplasm of breast: Secondary | ICD-10-CM | POA: Diagnosis not present

## 2017-03-14 DIAGNOSIS — Z6824 Body mass index (BMI) 24.0-24.9, adult: Secondary | ICD-10-CM | POA: Diagnosis not present

## 2017-03-14 DIAGNOSIS — R875 Abnormal microbiological findings in specimens from female genital organs: Secondary | ICD-10-CM | POA: Diagnosis not present

## 2017-03-14 DIAGNOSIS — Z01419 Encounter for gynecological examination (general) (routine) without abnormal findings: Secondary | ICD-10-CM | POA: Diagnosis not present

## 2017-03-14 DIAGNOSIS — Z1151 Encounter for screening for human papillomavirus (HPV): Secondary | ICD-10-CM | POA: Diagnosis not present

## 2017-05-09 ENCOUNTER — Ambulatory Visit (INDEPENDENT_AMBULATORY_CARE_PROVIDER_SITE_OTHER): Payer: BLUE CROSS/BLUE SHIELD | Admitting: Adult Health

## 2017-05-09 ENCOUNTER — Encounter: Payer: Self-pay | Admitting: Adult Health

## 2017-05-09 VITALS — BP 102/60 | Temp 98.6°F | Ht 65.5 in | Wt 151.7 lb

## 2017-05-09 DIAGNOSIS — Z0000 Encounter for general adult medical examination without abnormal findings: Secondary | ICD-10-CM | POA: Diagnosis not present

## 2017-05-09 LAB — LIPID PANEL
CHOL/HDL RATIO: 2
Cholesterol: 158 mg/dL (ref 0–200)
HDL: 64.5 mg/dL (ref >39.00)
LDL CALC: 85 mg/dL (ref 0–99)
NONHDL: 93.09
Triglycerides: 41 mg/dL (ref 0.0–149.0)
VLDL: 8.2 mg/dL (ref 0.0–40.0)

## 2017-05-09 LAB — BASIC METABOLIC PANEL
BUN: 9 mg/dL (ref 6–23)
CHLORIDE: 103 meq/L (ref 96–112)
CO2: 26 mEq/L (ref 19–32)
Calcium: 9.2 mg/dL (ref 8.4–10.5)
Creatinine, Ser: 0.62 mg/dL (ref 0.40–1.20)
GFR: 133.49 mL/min (ref >60.00)
Glucose, Bld: 82 mg/dL (ref 70–99)
POTASSIUM: 3.9 meq/L (ref 3.5–5.1)
Sodium: 138 mEq/L (ref 135–145)

## 2017-05-09 LAB — VITAMIN D 25 HYDROXY (VIT D DEFICIENCY, FRACTURES): VITD: 21.48 ng/mL — AB (ref 30.00–100.00)

## 2017-05-09 LAB — CBC WITH DIFFERENTIAL/PLATELET
BASOS PCT: 1.1 % (ref 0.0–3.0)
Basophils Absolute: 0 10*3/uL (ref 0.0–0.1)
EOS ABS: 0 10*3/uL (ref 0.0–0.7)
EOS PCT: 0.8 % (ref 0.0–5.0)
HEMATOCRIT: 32.1 % — AB (ref 36.0–46.0)
HEMOGLOBIN: 9.9 g/dL — AB (ref 12.0–15.0)
LYMPHS PCT: 43.7 % (ref 12.0–46.0)
Lymphs Abs: 1.2 10*3/uL (ref 0.7–4.0)
MCHC: 30.7 g/dL (ref 30.0–36.0)
MCV: 67.8 fl — ABNORMAL LOW (ref 78.0–100.0)
MONO ABS: 0.3 10*3/uL (ref 0.1–1.0)
Monocytes Relative: 10 % (ref 3.0–12.0)
Neutro Abs: 1.3 10*3/uL — ABNORMAL LOW (ref 1.4–7.7)
Neutrophils Relative %: 44.4 % (ref 43.0–77.0)
Platelets: 197 10*3/uL (ref 150.0–400.0)
RBC: 4.73 Mil/uL (ref 3.87–5.11)
RDW: 16.9 % — AB (ref 11.5–15.5)
WBC: 2.8 10*3/uL — AB (ref 4.0–10.5)

## 2017-05-09 LAB — HEPATIC FUNCTION PANEL
ALT: 15 U/L (ref 0–35)
AST: 18 U/L (ref 0–37)
Albumin: 4.3 g/dL (ref 3.5–5.2)
Alkaline Phosphatase: 48 U/L (ref 39–117)
BILIRUBIN DIRECT: 0.1 mg/dL (ref 0.0–0.3)
BILIRUBIN TOTAL: 0.4 mg/dL (ref 0.2–1.2)
TOTAL PROTEIN: 7.2 g/dL (ref 6.0–8.3)

## 2017-05-09 LAB — TSH: TSH: 0.91 u[IU]/mL (ref 0.35–4.50)

## 2017-05-09 NOTE — Progress Notes (Signed)
Subjective:    Patient ID: Lauren Wiley, female    DOB: [birth year], 46 y.o.   MRN: [id number]  HPI  Patient presents for yearly preventative medicine examination. She is a pleasant, active and healthy 46 year old female who  has a past medical history of Seasonal allergies.  All immunizations and health maintenance protocols were reviewed with the patient and needed orders were placed.  Appropriate screening laboratory values were ordered for the patient including screening of hyperlipidemia, renal function and hepatic function.  Medication reconciliation,  past medical history, social history, problem list and allergies were reviewed in detail with the patient  Goals were established with regard to weight loss, exercise, and  diet in compliance with medications. She is eating healthy and is staying active.   She sees Dr.Cousins for her GYN care and was seen two months ago.   She has no acute complaints.    Review of Systems  Constitutional: Negative.   HENT: Negative.   Eyes: Negative.   Respiratory: Negative.   Cardiovascular: Negative.   Gastrointestinal: Negative.   Endocrine: Negative.   Genitourinary: Negative.   Musculoskeletal: Negative.   Skin: Negative.   Allergic/Immunologic: Negative.   Neurological: Negative.   Hematological: Negative.   Psychiatric/Behavioral: Negative.   All other systems reviewed and are negative.  Past Medical History:  Diagnosis Date  . Seasonal allergies     Social History   Social History  . Marital status: Single    Spouse name: N/A  . Number of children: N/A  . Years of education: N/A   Occupational History  . Not on file.   Social History Main Topics  . Smoking status: Former Smoker    Quit date: 11/20/1990  . Smokeless tobacco: Never Used  . Alcohol use No  . Drug use: No  . Sexual activity: Yes   Other Topics Concern  . 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     No past surgical history on file.  Family History  Problem Relation Age of Onset  . Arrhythmia Mother   . Hypotension Father   . Heart attack Paternal Uncle   . Arrhythmia Maternal Grandmother   . Heart attack Paternal Grandmother   . Heart attack Paternal Grandfather   . Heart failure Neg Hx   . Diabetes Mellitus II Mother   . Diabetes Mellitus II Father   . Hypertension Mother   . Diabetes Mellitus II Maternal Grandmother     Allergies  Allergen Reactions  . Augmentin [Amoxicillin-Pot Clavulanate] Other (See Comments)    Throat swelling and soreness     No current outpatient prescriptions on file prior to visit.   No current facility-administered medications on file prior to visit.     BP 102/60 (BP Location: Right Arm, Patient Position: Sitting, Cuff Size: Small)   Temp 98.6 F (37 C) (Oral)   Ht 5' 5.5" (1.664 m)   Wt 151 lb 11.2 oz (68.8 kg)   BMI 24.86 kg/m       Objective:   Physical Exam  Constitutional: She is oriented to person, place, and time. She appears well-developed and well-nourished. No distress.  HENT:  Head: Normocephalic and atraumatic.  Right Ear: External ear normal.  Left Ear: External ear normal.  Nose: Nose normal.  Mouth/Throat: Oropharynx is clear and moist. No oropharyngeal exudate.  Eyes: Conjunctivae and EOM are normal. Pupils are equal, round, and reactive to light. Right eye exhibits no discharge. Left eye exhibits no discharge. No scleral icterus.  Neck: Normal range of motion. Neck supple. No JVD present. No tracheal deviation present. No thyromegaly present.  Cardiovascular: Normal rate, regular rhythm, normal heart sounds and intact distal pulses.  Exam reveals no gallop and no friction rub.   No murmur heard. Pulmonary/Chest: Effort normal and breath sounds normal. No respiratory distress. She has no wheezes. She has no rales. She exhibits no tenderness.    Abdominal: Soft. Bowel sounds are normal. She exhibits no distension and no mass. There is no tenderness. There is no rebound and no guarding.  Genitourinary:  Genitourinary Comments: Done by GYN   Musculoskeletal: Normal range of motion. She exhibits no edema, tenderness or deformity.  Lymphadenopathy:    She has no cervical adenopathy.  Neurological: She is alert and oriented to person, place, and time. She has normal reflexes. She displays normal reflexes. No cranial nerve deficit. She exhibits normal muscle tone. Coordination normal.  Skin: Skin is warm and dry. No rash noted. She is not diaphoretic. No erythema. No pallor.  Psychiatric: She has a normal mood and affect. Her behavior is normal. Judgment and thought content normal.  Nursing note and vitals reviewed.     Assessment & Plan:  1. Routine general medical examination at a health care facility - Healthy 46 year old female. Benign exam  - Continue to eat healthy and exercise on a regular basis  - Basic metabolic panel - CBC with Differential/Platelet - Hepatic function panel - Lipid panel - TSH - Vitamin D, 25-hydroxy - Follow up in one year or sooner if needed  Dorothyann Peng, NP

## 2017-08-22 ENCOUNTER — Ambulatory Visit (INDEPENDENT_AMBULATORY_CARE_PROVIDER_SITE_OTHER): Payer: BLUE CROSS/BLUE SHIELD | Admitting: Adult Health

## 2017-08-22 ENCOUNTER — Ambulatory Visit: Payer: BLUE CROSS/BLUE SHIELD | Admitting: Adult Health

## 2017-08-22 ENCOUNTER — Encounter (INDEPENDENT_AMBULATORY_CARE_PROVIDER_SITE_OTHER): Payer: Self-pay

## 2017-08-22 ENCOUNTER — Encounter: Payer: Self-pay | Admitting: Adult Health

## 2017-08-22 VITALS — BP 114/60 | Temp 98.2°F | Wt 154.0 lb

## 2017-08-22 DIAGNOSIS — K2100 Gastro-esophageal reflux disease with esophagitis, without bleeding: Secondary | ICD-10-CM

## 2017-08-22 MED ORDER — OMEPRAZOLE 40 MG PO CPDR: 40.0000 mg | DELAYED_RELEASE_CAPSULE | Freq: Every day | ORAL | 3 refills | Status: DC

## 2017-08-22 NOTE — Progress Notes (Signed)
Subjective:    Patient ID: Lauren Wiley, female    DOB: 12-Jun-1971, 46 y.o.   MRN: [id number]  HPI  46 year old female who  has a past medical history of Seasonal allergies. she presents to the office today for GERD like symptoms. She reports that over the last three months she has been experiencing burning in her throat, experiencing a dry cough, and  excessive burping. Her symptoms have been present on and off for the last 3 months.   She has cut out coffee, acidic foods, and fried foods without resolution   She denies any abdominal pain, nausea or diarrhea .   She has tried OTC omeprazole, and Gaviscon with mild relief.    Review of Systems See HPI   Past Medical History:  Diagnosis Date  . Seasonal allergies     Social History   Social History  . Marital status: Single    Spouse name: N/A  . Number of children: N/A  . Years of education: N/A   Occupational History  . Not on file.   Social History Main Topics  . Smoking status: Former Smoker    Quit date: 11/20/1990  . Smokeless tobacco: Never Used  . Alcohol use No  . Drug use: No  . Sexual activity: Yes   Other Topics Concern  . 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     No past surgical history on file.  Family History  Problem Relation Age of Onset  . Arrhythmia Mother   . Diabetes Mellitus II Mother   . Hypertension Mother   . Hypotension Father   . Diabetes Mellitus II Father   . Heart attack Paternal Uncle   . Arrhythmia Maternal Grandmother   . Diabetes Mellitus II Maternal Grandmother   . Heart attack Paternal Grandmother   . Heart attack Paternal Grandfather   . Heart failure Neg Hx     Allergies  Allergen Reactions  . Augmentin [Amoxicillin-Pot Clavulanate] Other (See Comments)    Throat swelling and soreness     No current outpatient prescriptions on file prior to visit.     No current facility-administered medications on file prior to visit.     BP 114/60 (BP Location: Left Arm)   Temp 98.2 F (36.8 C) (Oral)   Wt 154 lb (69.9 kg)   BMI 25.24 kg/m       Objective:   Physical Exam  Constitutional: She is oriented to person, place, and time. She appears well-developed and well-nourished. No distress.  Cardiovascular: Regular rhythm, normal heart sounds and intact distal pulses.  Exam reveals no gallop and no friction rub.   No murmur heard. Pulmonary/Chest: Effort normal and breath sounds normal. No respiratory distress. She has no wheezes. She has no rales. She exhibits no tenderness.  Abdominal: Soft. Bowel sounds are normal. She exhibits no distension and no mass. There is no tenderness. There is no rebound and no guarding.  Neurological: She is alert and oriented to person, place, and time.  Skin: Skin is warm and dry. No rash noted. She is not diaphoretic. No erythema. No pallor.  Psychiatric: She has a normal mood and affect. Her behavior is normal. Judgment and thought content normal.  Nursing note and vitals reviewed.     Assessment & Plan:  1. Gastroesophageal reflux disease with esophagitis - advised  to continue to to cut out coffee, acidic foods, and fast foods.  - Will trial her on omeprazole 40 mg for one month  - omeprazole (PRILOSEC) 40 MG capsule; Take 1 capsule (40 mg total) by mouth daily.  Dispense: 30 capsule; Refill: 3 - Follow up as needed  Dorothyann Peng, NP

## 2018-02-04 ENCOUNTER — Telehealth: Payer: Self-pay | Admitting: Family Medicine

## 2018-02-04 DIAGNOSIS — K2100 Gastro-esophageal reflux disease with esophagitis, without bleeding: Secondary | ICD-10-CM

## 2018-02-04 NOTE — Telephone Encounter (Signed)
Copied from [place] [phone]. Topic: General - Other >> Feb 04, 2018 11:27 AM Oneta Rack wrote: Relation to MC:EYEM Call back number: [phone]  Reason for call:  Patient last seen 08/22/18 for Gastroesophageal reflux disease with esophagitis and states since appointment patient experienced acid reflux flare up, throat irratation when swallowing and coughing while talking. Patient on her second acid reflux medication refill and wanted to make PCP aware of symptoms, please advise     >> Feb 04, 2018 12:10 PM Oneta Rack wrote: Relation to NP:YYFR Call back number: [phone]  Reason for call:  Patient last seen 08/22/18 for Gastroesophageal reflux disease with esophagitis and states since appointment patient experienced acid reflux flare up, throat irratation when swallowing and coughing while talking. Patient on her second acid reflux medication refill and wanted to make PCP aware of symptoms, please advise

## 2018-02-05 MED ORDER — OMEPRAZOLE 40 MG PO CPDR: 40.0000 mg | DELAYED_RELEASE_CAPSULE | Freq: Every day | ORAL | 0 refills | Status: DC

## 2018-02-05 NOTE — Telephone Encounter (Signed)
Spoke to the pt.  She was taking omeprazole 40 mg in the past and taking everyday.  She stopped and is now having a flare.  She has a burning sensations, throat irritation and her voice has been impacted.  Advised the pt to restart medication and to take everyday.  Do not stop medication until directed by [facility].  Pt stated she was not aware that she should take continuously.  She will call back in 1-2 wks if not better.  Pt did state she did not have a flare while on medication in the past.  Will send in a refill for the pt.

## 2018-02-19 ENCOUNTER — Encounter: Payer: Self-pay | Admitting: Adult Health

## 2018-02-19 ENCOUNTER — Ambulatory Visit: Payer: BLUE CROSS/BLUE SHIELD | Admitting: Adult Health

## 2018-02-19 ENCOUNTER — Encounter (INDEPENDENT_AMBULATORY_CARE_PROVIDER_SITE_OTHER): Payer: Self-pay

## 2018-02-19 VITALS — BP 102/52 | Temp 98.4°F | Wt 151.0 lb

## 2018-02-19 DIAGNOSIS — R1310 Dysphagia, unspecified: Secondary | ICD-10-CM

## 2018-02-19 DIAGNOSIS — K2100 Gastro-esophageal reflux disease with esophagitis, without bleeding: Secondary | ICD-10-CM

## 2018-02-19 NOTE — Progress Notes (Signed)
Subjective:    Patient ID: Lauren Wiley, female    DOB: 1971/10/22, 47 y.o.   MRN: [id number]  HPI  47 year old female who  has a past medical history of Seasonal allergies. She presents to the office today for follow up on GERD like symptoms. She reports that she is taking Omeprazole 40 mg daily. She now feels as though she is constantly feeling as though there is blockage on the right side of her throat when she swallows ( food and liquid). She also complains of dry cough and hoarse voice as well. Continues to have a burning sensation in epigastric area   Review of Systems See HPI   Past Medical History:  Diagnosis Date  . Seasonal allergies     Social History   Socioeconomic History  . Marital status: Single    Spouse name: Not on file  . Number of children: Not on file  . Years of education: Not on file  . Highest education level: Not on file  Occupational History  . Not on file  Social Needs  . Financial resource strain: Not on file  . Food insecurity:    Worry: Not on file    Inability: Not on file  . Transportation needs:    Medical: Not on file    Non-medical: Not on file  Tobacco Use  . Smoking status: Former Smoker    Last attempt to quit: 11/20/1990    Years since quitting: 27.2  . Smokeless tobacco: Never Used  Substance and Sexual Activity  . Alcohol use: No    Alcohol/week: 0.0 oz  . Drug use: No  . Sexual activity: Yes  Lifestyle  . Physical activity:    Days per week: Not on file    Minutes per session: Not on file  . Stress: Not on file  Relationships  . Social connections:    Talks on phone: Not on file    Gets together: Not on file    Attends religious service: Not on file    Active member of club or organization: Not on file    Attends meetings of clubs or organizations: Not on file    Relationship status: Not on file  . Intimate partner violence:    Fear of current or ex partner: Not on file    Emotionally abused: Not on file   Physically abused: Not on file    Forced sexual activity: Not on file  Other Topics Concern  . 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     History reviewed. No pertinent surgical history.  Family History  Problem Relation Age of Onset  . Arrhythmia Mother   . Diabetes Mellitus II Mother   . Hypertension Mother   . Hypotension Father   . Diabetes Mellitus II Father   . Heart attack Paternal Uncle   . Arrhythmia Maternal Grandmother   . Diabetes Mellitus II Maternal Grandmother   . Heart attack Paternal Grandmother   . Heart attack Paternal Grandfather   . Heart failure Neg Hx     Allergies  Allergen Reactions  . Augmentin [Amoxicillin-Pot Clavulanate] Other (See Comments)    Throat swelling and soreness     Current Outpatient Medications on File Prior to Visit  Medication Sig Dispense Refill  . omeprazole (PRILOSEC) 40 MG capsule Take 1 capsule (40 mg total)  by mouth daily. 90 capsule 0   No current facility-administered medications on file prior to visit.     BP (!) 102/52   Temp 98.4 F (36.9 C) (Oral)   Wt 151 lb (68.5 kg)   BMI 24.75 kg/m       Objective:   Physical Exam  Constitutional: She is oriented to person, place, and time. She appears well-developed and well-nourished. No distress.  Neck: Normal range of motion. Neck supple. No thyromegaly present.  Cardiovascular: Normal rate, regular rhythm, normal heart sounds and intact distal pulses. Exam reveals no gallop.  No murmur heard. Pulmonary/Chest: Effort normal and breath sounds normal. No respiratory distress. She has no wheezes. She has no rales.  Lymphadenopathy:    She has no cervical adenopathy.  Neurological: She is alert and oriented to person, place, and time.  Skin: Skin is warm and dry. No rash noted. She is not diaphoretic. No erythema. No pallor.  Psychiatric: She has a normal mood and  affect. Her behavior is normal. Judgment and thought content normal.  Nursing note and vitals reviewed.     Assessment & Plan:  1. Gastroesophageal reflux disease with esophagitis - Continue with Prilosec  - Ambulatory referral to Gastroenterology  2. Dysphagia, unspecified type  - Ambulatory referral to Gastroenterology   Dorothyann Peng, NP

## 2018-02-20 ENCOUNTER — Encounter: Payer: Self-pay | Admitting: Gastroenterology

## 2018-03-15 DIAGNOSIS — Z1231 Encounter for screening mammogram for malignant neoplasm of breast: Secondary | ICD-10-CM | POA: Diagnosis not present

## 2018-03-15 DIAGNOSIS — D509 Iron deficiency anemia, unspecified: Secondary | ICD-10-CM | POA: Diagnosis not present

## 2018-03-15 DIAGNOSIS — Z1151 Encounter for screening for human papillomavirus (HPV): Secondary | ICD-10-CM | POA: Diagnosis not present

## 2018-03-15 DIAGNOSIS — N920 Excessive and frequent menstruation with regular cycle: Secondary | ICD-10-CM | POA: Diagnosis not present

## 2018-03-15 DIAGNOSIS — Z3A24 24 weeks gestation of pregnancy: Secondary | ICD-10-CM | POA: Diagnosis not present

## 2018-03-15 DIAGNOSIS — N9089 Other specified noninflammatory disorders of vulva and perineum: Secondary | ICD-10-CM | POA: Diagnosis not present

## 2018-03-15 DIAGNOSIS — Z01419 Encounter for gynecological examination (general) (routine) without abnormal findings: Secondary | ICD-10-CM | POA: Diagnosis not present

## 2018-04-02 DIAGNOSIS — D509 Iron deficiency anemia, unspecified: Secondary | ICD-10-CM | POA: Diagnosis not present

## 2018-04-03 ENCOUNTER — Encounter: Payer: Self-pay | Admitting: Gastroenterology

## 2018-04-03 ENCOUNTER — Ambulatory Visit: Payer: BLUE CROSS/BLUE SHIELD | Admitting: Gastroenterology

## 2018-04-03 VITALS — BP 104/68 | HR 76 | Ht 65.5 in | Wt 148.8 lb

## 2018-04-03 DIAGNOSIS — D500 Iron deficiency anemia secondary to blood loss (chronic): Secondary | ICD-10-CM

## 2018-04-03 DIAGNOSIS — R1310 Dysphagia, unspecified: Secondary | ICD-10-CM

## 2018-04-03 DIAGNOSIS — K219 Gastro-esophageal reflux disease without esophagitis: Secondary | ICD-10-CM | POA: Diagnosis not present

## 2018-04-03 NOTE — Progress Notes (Signed)
History of Present Illness: This is a 47 year old female referred by Dorothyann Peng, NP for the evaluation of dysphagia and GERD.  She has a history of reflux symptoms dating back to 2010. I reviewed her office note from 2010. She states that after that she only had mild intermittent reflux symptoms since 2010, once or twice a month, which she has treated easily with Zantac OTC as needed.  Over the past few months she has had worsening problems with heartburn, regurgitation, belching and solid food dysphagia.  She has induced vomiting to relieve episodes of dysphagia. She was treated with omeprazole 40 mg daily and her symptoms slightly improved however all symptoms except heartburn persist.  She is taking aloe vera juice and Tums as needed which are helpful.  She was recently diagnosed with iron deficiency anemia by her gynecologist, felt secondary to menorrhagia.  She is taking oral iron and has received 1 of 2 planned iron infusions.  States her hemoglobin was 8.1 several weeks ago when her anemia was diagnosed. Denies weight loss, abdominal pain, constipation, diarrhea, change in stool caliber, melena, hematochezia, nausea, vomiting, chest pain.   Allergies  Allergen Reactions  . Augmentin [Amoxicillin-Pot Clavulanate] Other (See Comments)    Throat swelling and soreness    Outpatient Medications Prior to Visit  Medication Sig Dispense Refill  . ferrous sulfate 325 (65 FE) MG tablet Take 325 mg by mouth daily with breakfast.    . omeprazole (PRILOSEC) 40 MG capsule Take 1 capsule (40 mg total) by mouth daily. 90 capsule 0   No facility-administered medications prior to visit.    Past Medical History:  Diagnosis Date  . Anemia   . GERD (gastroesophageal reflux disease)   . Seasonal allergies   . Uterine fibroid    Past Surgical History:  Procedure Laterality Date  . none     Social History   Socioeconomic History  . Marital status: Single    Spouse name: Not on file  . Number  of children: Not on file  . Years of education: Not on file  . Highest education level: Not on file  Occupational History  . Not on file  Social Needs  . Financial resource strain: Not on file  . Food insecurity:    Worry: Not on file    Inability: Not on file  . Transportation needs:    Medical: Not on file    Non-medical: Not on file  Tobacco Use  . Smoking status: Former Smoker    Last attempt to quit: 11/20/1990    Years since quitting: 27.3  . Smokeless tobacco: Never Used  Substance and Sexual Activity  . Alcohol use: Yes    Alcohol/week: 0.0 oz    Comment: once every 2-3 months  . Drug use: No  . Sexual activity: Yes  Lifestyle  . Physical activity:    Days per week: Not on file    Minutes per session: Not on file  . Stress: Not on file  Relationships  . Social connections:    Talks on phone: Not on file    Gets together: Not on file    Attends religious service: Not on file    Active member of club or organization: Not on file    Attends meetings of clubs or organizations: Not on file    Relationship status: Not on file  Other Topics Concern  . 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    Family History  Problem Relation Age of Onset  . Arrhythmia Mother   . Diabetes Mellitus II Mother   . Hypertension Mother   . Hypotension Father   . Diabetes Mellitus II Father   . Heart attack Paternal Uncle   . Arrhythmia Maternal Grandmother   . Diabetes Mellitus II Maternal Grandmother   . Heart attack Paternal Grandmother   . Heart attack Paternal Grandfather   . Liver disease Maternal Grandfather        alcohol related  . Heart failure Neg Hx   . Colon cancer Neg Hx   . Esophageal cancer Neg Hx   . Pancreatic cancer Neg Hx   . Stomach cancer Neg Hx        Review of Systems: Pertinent positive and negative review of systems were noted in the above HPI  section. All other review of systems were otherwise negative.    Physical Exam: General: Well developed, well nourished, no acute distress Head: Normocephalic and atraumatic Eyes:  sclerae anicteric, EOMI Ears: Normal auditory acuity Mouth: No deformity or lesions Neck: Supple, no masses or thyromegaly Lungs: Clear throughout to auscultation Heart: Regular rate and rhythm; no murmurs, rubs or bruits Abdomen: Soft, non tender and non distended. No masses, hepatosplenomegaly or hernias noted. Normal Bowel sounds Rectal: Not done Musculoskeletal: Symmetrical with no gross deformities  Skin: No lesions on visible extremities Pulses:  Normal pulses noted Extremities: No clubbing, cyanosis, edema or deformities noted Neurological: Alert oriented x 4, grossly nonfocal Cervical Nodes:  No significant cervical adenopathy Inguinal Nodes: No significant inguinal adenopathy Psychological:  Alert and cooperative. Normal mood and affect  Assessment and Recommendations:  1. GERD, dysphagia. R/O esophagitis, stricture. Continue omeprazole 40 mg po qam. Follow antireflux measures. Schedule barium esophagram. Schedule EGD with possible dilation. The risks (including bleeding, perforation, infection, missed lesions, medication reactions and possible hospitalization or surgery if complications occur), benefits, and alternatives to endoscopy with possible biopsy and possible dilation were discussed with the patient and they consent to proceed.   2. Iron deficiency anemia, secondary to menorrhage. Follow up with her gyn as planned. If it is refractory consider colonoscopy for further evaluation.    cc: Dorothyann Peng, NP [address] [place], [place] [address]

## 2018-04-03 NOTE — Patient Instructions (Signed)
Patient advised to avoid spicy, acidic, citrus, chocolate, mints, fruit and fruit juices.  Limit the intake of caffeine, alcohol and Soda.  Don't exercise too soon after eating.  Don't lie down within 3-4 hours of eating.  Elevate the head of your bed.  You have been scheduled for a Barium Esophogram at [facility] (1st floor of the hospital) on 04/08/18 at 11:30am. Please arrive 15 minutes prior to your appointment for registration. Make certain not to have anything to eat or drink 6 hours prior to your test. If you need to reschedule for any reason, please contact radiology at [phone] to do so. __________________________________________________________________ A barium swallow is an examination that concentrates on views of the esophagus. This tends to be a double contrast exam (barium and two liquids which, when combined, create a gas to distend the wall of the oesophagus) or single contrast (non-ionic iodine based). The study is usually tailored to your symptoms so a good history is essential. Attention is paid during the study to the form, structure and configuration of the esophagus, looking for functional disorders (such as aspiration, dysphagia, achalasia, motility and reflux) EXAMINATION You may be asked to change into a gown, depending on the type of swallow being performed. A radiologist and radiographer will perform the procedure. The radiologist will advise you of the type of contrast selected for your procedure and direct you during the exam. You will be asked to stand, sit or lie in several different positions and to hold a small amount of fluid in your mouth before being asked to swallow while the imaging is performed .In some instances you may be asked to swallow barium coated marshmallows to assess the motility of a solid food bolus. The exam can be recorded as a digital or video fluoroscopy procedure. POST PROCEDURE It will take 1-2 days for the barium to pass through your  system. To facilitate this, it is important, unless otherwise directed, to increase your fluids for the next 24-48hrs and to resume your normal diet.  This test typically takes about 30 minutes to perform. ________________________________________________________________________  Lauren Wiley have been scheduled for an endoscopy. Please follow written instructions given to you at your visit today. If you use inhalers (even only as needed), please bring them with you on the day of your procedure. Your physician has requested that you go to www.startemmi.com and enter the access code given to you at your visit today. This web site gives a general overview about your procedure. However, you should still follow specific instructions given to you by our office regarding your preparation for the procedure.  Normal BMI (Body Mass Index- based on height and weight) is between 19 and 25. Your BMI today is Body mass index is 24.39 kg/m. Marland Kitchen Please consider follow up  regarding your BMI with your Primary Care Provider.  Thank you for choosing me and [facility].  Pricilla Riffle. Dagoberto Ligas., MD., Marval Regal

## 2018-04-08 ENCOUNTER — Ambulatory Visit (HOSPITAL_COMMUNITY): Payer: BLUE CROSS/BLUE SHIELD

## 2018-04-10 DIAGNOSIS — D509 Iron deficiency anemia, unspecified: Secondary | ICD-10-CM | POA: Diagnosis not present

## 2018-04-11 ENCOUNTER — Ambulatory Visit (HOSPITAL_COMMUNITY)
Admission: RE | Admit: 2018-04-11 | Discharge: 2018-04-11 | Disposition: A | Discharge status: Home / Self Care | Payer: BLUE CROSS/BLUE SHIELD | Source: Ambulatory Visit | Attending: Gastroenterology | Admitting: Gastroenterology

## 2018-04-11 DIAGNOSIS — K229 Disease of esophagus, unspecified: Secondary | ICD-10-CM | POA: Diagnosis not present

## 2018-04-11 DIAGNOSIS — R1310 Dysphagia, unspecified: Secondary | ICD-10-CM | POA: Diagnosis not present

## 2018-04-11 DIAGNOSIS — K219 Gastro-esophageal reflux disease without esophagitis: Secondary | ICD-10-CM | POA: Insufficient documentation

## 2018-04-18 ENCOUNTER — Ambulatory Visit (AMBULATORY_SURGERY_CENTER): Payer: BLUE CROSS/BLUE SHIELD | Admitting: Gastroenterology

## 2018-04-18 ENCOUNTER — Encounter: Payer: Self-pay | Admitting: Gastroenterology

## 2018-04-18 ENCOUNTER — Other Ambulatory Visit: Payer: Self-pay

## 2018-04-18 VITALS — BP 117/75 | HR 92 | Temp 98.0°F | Resp 13 | Ht 65.5 in | Wt 148.0 lb

## 2018-04-18 DIAGNOSIS — R1310 Dysphagia, unspecified: Secondary | ICD-10-CM | POA: Diagnosis present

## 2018-04-18 DIAGNOSIS — K219 Gastro-esophageal reflux disease without esophagitis: Secondary | ICD-10-CM

## 2018-04-18 DIAGNOSIS — R933 Abnormal findings on diagnostic imaging of other parts of digestive tract: Secondary | ICD-10-CM | POA: Diagnosis not present

## 2018-04-18 DIAGNOSIS — K222 Esophageal obstruction: Secondary | ICD-10-CM

## 2018-04-18 MED ORDER — SODIUM CHLORIDE 0.9 % IV SOLN: 500.0000 mL | Freq: Once | INTRAVENOUS | Status: DC

## 2018-04-18 MED ORDER — FLUCONAZOLE 100 MG PO TABS: 100.0000 mg | ORAL_TABLET | Freq: Every day | ORAL | 0 refills | Status: AC

## 2018-04-18 NOTE — Op Note (Signed)
[facility] Patient Name: Lauren Wiley Procedure Date: 04/18/2018 8:00 AM MRN: [id number] Endoscopist: Ladene Artist , MD Age: 47 Referring MD:  Date of Birth: 07-31-1971 Gender: Female Account #: [id number] Procedure:                Upper GI endoscopy Indications:              Dysphagia, Abnormal barium esophagram, GERD Medicines:                Monitored Anesthesia Care Procedure:                Pre-Anesthesia Assessment:                           - Prior to the procedure, a History and Physical                            was performed, and patient medications and                            allergies were reviewed. The patient's tolerance of                            previous anesthesia was also reviewed. The risks                            and benefits of the procedure and the sedation                            options and risks were discussed with the patient.                            All questions were answered, and informed consent                            was obtained. Prior Anticoagulants: The patient has                            taken no previous anticoagulant or antiplatelet                            agents. ASA Grade Assessment: II - A patient with                            mild systemic disease. After reviewing the risks                            and benefits, the patient was deemed in                            satisfactory condition to undergo the procedure.                           After obtaining informed consent, the endoscope was  passed under direct vision. Throughout the                            procedure, the patient's blood pressure, pulse, and                            oxygen saturations were monitored continuously. The                            Model GIF-[id number]) scope was introduced                            through the mouth, and advanced to the second part                            of  duodenum. The upper GI endoscopy was                            accomplished without difficulty. The patient                            tolerated the procedure fairly well with                            laryngospam and secretions that required scope                            withdrawal, bagging and suctioning. Biopsies of                            suspected candida not performed to reduce the                            procedure duration. Scope In: Scope Out: Findings:                 One benign-appearing, intrinsic mild stenosis was                            found at the gastroesophageal junction. This                            stenosis measured 1.3 cm (inner diameter). The                            stenosis was traversed. A guidewire was placed and                            the scope was withdrawn. Dilations were performed                            with Savary dilators with mild resistance at 14 mm,                            15 mm and  16 mm.                           Localized, white plaques were found in the proximal                            esophagus.                           The exam of the esophagus was otherwise normal.                           A small hiatal hernia was present.                           The exam of the stomach was otherwise normal.                           The duodenal bulb and second portion of the                            duodenum were normal. Complications:            Laryngospasm Estimated Blood Loss:     Estimated blood loss: none. Impression:               - Benign-appearing esophageal stenosis. Dilated.                           - Esophageal plaques were found, consistent with                            candidiasis.                           - Small hiatal hernia.                           - Normal duodenal bulb and second portion of the                            duodenum.                           - No specimens  collected. Recommendation:           - Patient has a contact number available for                            emergencies. The signs and symptoms of potential                            delayed complications were discussed with the                            patient. Return to normal activities tomorrow.  Written discharge instructions were provided to the                            patient.                           - Clear liquid diet for 2 hours, then advance as                            tolerated to soft diet today.                           - Resume prior diet tomorrow.                           - Antireflux measures.                           - Continue present medications.                           - Diflucan 100 mg po qd, #7, no refills.                           - Return to GI office in 2 months. Ladene Artist, MD 04/18/2018 8:38:34 AM This report has been signed electronically.

## 2018-04-18 NOTE — Patient Instructions (Signed)
Thank you for allowing [place] to care for you today!  Clear liquid diet for 2 hours then progress to a soft diet today.  May resume prior diet tomorrow.  Antireflux measures.  Begin Diflucan 100 mg by mouth every day x 7 days.  No refills.  Return to GI office in 2 months.    YOU HAD AN ENDOSCOPIC PROCEDURE TODAY AT [facility]:   Refer to the procedure report that was given to you for any specific questions about what was found during the examination.  If the procedure report does not answer your questions, please call your gastroenterologist to clarify.  If you requested that your care partner not be given the details of your procedure findings, then the procedure report has been included in a sealed envelope for you to review at your convenience later.  YOU SHOULD EXPECT: Some feelings of bloating in the abdomen. Passage of more gas than usual.  Walking can help get rid of the air that was put into your GI tract during the procedure and reduce the bloating. If you had a lower endoscopy (such as a colonoscopy or flexible sigmoidoscopy) you may notice spotting of blood in your stool or on the toilet paper. If you underwent a bowel prep for your procedure, you may not have a normal bowel movement for a few days.  Please Note:  You might notice some irritation and congestion in your nose or some drainage.  This is from the oxygen used during your procedure.  There is no need for concern and it should clear up in a day or so.  SYMPTOMS TO REPORT IMMEDIATELY:     Following upper endoscopy (EGD)  Vomiting of blood or coffee ground material  New chest pain or pain under the shoulder blades  Painful or persistently difficult swallowing  New shortness of breath  Fever of 100F or higher  Black, tarry-looking stools  For urgent or emergent issues, a gastroenterologist can be reached at any hour by calling [phone].   DIET:  We do recommend a small meal at first, but  then you may proceed to your regular diet.  Drink plenty of fluids but you should avoid alcoholic beverages for 24 hours.  ACTIVITY:  You should plan to take it easy for the rest of today and you should NOT DRIVE or use heavy machinery until tomorrow (because of the sedation medicines used during the test).    FOLLOW UP: Our staff will call the number listed on your records the next business day following your procedure to check on you and address any questions or concerns that you may have regarding the information given to you following your procedure. If we do not reach you, we will leave a message.  However, if you are feeling well and you are not experiencing any problems, there is no need to return our call.  We will assume that you have returned to your regular daily activities without incident.  If any biopsies were taken you will be contacted by phone or by letter within the next 1-3 weeks.  Please call us at [phone] if you have not heard about the biopsies in 3 weeks.    SIGNATURES/CONFIDENTIALITY: You and/or your care partner have signed paperwork which will be entered into your electronic medical record.  These signatures attest to the fact that that the information above on your After Visit Summary has been reviewed and is understood.  Full responsibility of the confidentiality of  this discharge information lies with you and/or your care-partner.

## 2018-04-18 NOTE — Progress Notes (Signed)
Called to room to assist during endoscopic procedure.  Patient ID and intended procedure confirmed with present staff. Received instructions for my participation in the procedure from the performing physician.  

## 2018-04-18 NOTE — Progress Notes (Signed)
Pt had laryngospasm during case broke with jaw thrust and bag/mask pressure. Sats seen at 58% lowest. Pt recovered fully and woke up. Having thick secretions, gave robinul 0.1mg  with some improvement in secretions. Pt then re sedated and dilation completed. Tb To PACU, VSS. Report to RN.tb

## 2018-04-19 ENCOUNTER — Telehealth: Payer: Self-pay | Admitting: *Deleted

## 2018-04-19 NOTE — Telephone Encounter (Signed)
  Follow up Call-  Call back number 04/18/2018  Post procedure Call Back phone  # [phone]  Permission to leave phone message Yes  Some recent data might be hidden     Patient questions:  Do you have a fever, pain , or abdominal swelling? No. Pain Score  0 *  Have you tolerated food without any problems? Yes.    Have you been able to return to your normal activities? Yes.    Do you have any questions about your discharge instructions: Diet   No. Medications  No. Follow up visit  No.  Do you have questions or concerns about your Care? No.  Actions: * If pain score is 4 or above: No action needed, pain <4.

## 2018-04-29 ENCOUNTER — Other Ambulatory Visit: Payer: Self-pay | Admitting: Obstetrics and Gynecology

## 2018-04-29 DIAGNOSIS — N920 Excessive and frequent menstruation with regular cycle: Secondary | ICD-10-CM

## 2018-05-01 ENCOUNTER — Ambulatory Visit
Admission: RE | Admit: 2018-05-01 | Discharge: 2018-05-01 | Disposition: A | Discharge status: Home / Self Care | Payer: BLUE CROSS/BLUE SHIELD | Source: Ambulatory Visit | Attending: Obstetrics and Gynecology | Admitting: Obstetrics and Gynecology

## 2018-05-01 DIAGNOSIS — D259 Leiomyoma of uterus, unspecified: Secondary | ICD-10-CM | POA: Diagnosis not present

## 2018-05-01 DIAGNOSIS — N920 Excessive and frequent menstruation with regular cycle: Secondary | ICD-10-CM

## 2018-05-10 ENCOUNTER — Encounter: Payer: Self-pay | Admitting: Adult Health

## 2018-05-10 ENCOUNTER — Ambulatory Visit (INDEPENDENT_AMBULATORY_CARE_PROVIDER_SITE_OTHER): Payer: BLUE CROSS/BLUE SHIELD | Admitting: Adult Health

## 2018-05-10 VITALS — BP 110/70 | Temp 98.6°F | Ht 65.5 in | Wt 144.0 lb

## 2018-05-10 DIAGNOSIS — K2100 Gastro-esophageal reflux disease with esophagitis, without bleeding: Secondary | ICD-10-CM

## 2018-05-10 DIAGNOSIS — Z0000 Encounter for general adult medical examination without abnormal findings: Secondary | ICD-10-CM

## 2018-05-10 DIAGNOSIS — D509 Iron deficiency anemia, unspecified: Secondary | ICD-10-CM | POA: Diagnosis not present

## 2018-05-10 DIAGNOSIS — Z114 Encounter for screening for human immunodeficiency virus [HIV]: Secondary | ICD-10-CM

## 2018-05-10 LAB — LIPID PANEL
Cholesterol: 149 mg/dL (ref 0–200)
HDL: 64.1 mg/dL (ref >39.00)
LDL CALC: 78 mg/dL (ref 0–99)
NONHDL: 84.74
Total CHOL/HDL Ratio: 2
Triglycerides: 32 mg/dL (ref 0.0–149.0)
VLDL: 6.4 mg/dL (ref 0.0–40.0)

## 2018-05-10 LAB — CBC WITH DIFFERENTIAL/PLATELET
Basophils Absolute: 0 10*3/uL (ref 0.0–0.1)
Basophils Relative: 0.8 % (ref 0.0–3.0)
EOS PCT: 1.3 % (ref 0.0–5.0)
Eosinophils Absolute: 0.1 10*3/uL (ref 0.0–0.7)
HCT: 38.4 % (ref 36.0–46.0)
HEMOGLOBIN: 12.4 g/dL (ref 12.0–15.0)
Lymphocytes Relative: 15 % (ref 12.0–46.0)
Lymphs Abs: 0.7 10*3/uL (ref 0.7–4.0)
MCHC: 32.4 g/dL (ref 30.0–36.0)
MCV: 77.2 fl — ABNORMAL LOW (ref 78.0–100.0)
MONO ABS: 0.5 10*3/uL (ref 0.1–1.0)
Monocytes Relative: 11.5 % (ref 3.0–12.0)
Neutro Abs: 3.1 10*3/uL (ref 1.4–7.7)
Neutrophils Relative %: 71.4 % (ref 43.0–77.0)
Platelets: 142 10*3/uL — ABNORMAL LOW (ref 150.0–400.0)
RBC: 4.97 Mil/uL (ref 3.87–5.11)
RDW: 31 % — ABNORMAL HIGH (ref 11.5–15.5)
WBC: 4.4 10*3/uL (ref 4.0–10.5)

## 2018-05-10 LAB — BASIC METABOLIC PANEL
BUN: 9 mg/dL (ref 6–23)
CO2: 25 mEq/L (ref 19–32)
Calcium: 9 mg/dL (ref 8.4–10.5)
Chloride: 104 mEq/L (ref 96–112)
Creatinine, Ser: 0.62 mg/dL (ref 0.40–1.20)
GFR: 132.9 mL/min (ref >60.00)
Glucose, Bld: 91 mg/dL (ref 70–99)
POTASSIUM: 3.8 meq/L (ref 3.5–5.1)
SODIUM: 136 meq/L (ref 135–145)

## 2018-05-10 LAB — TSH: TSH: 0.66 u[IU]/mL (ref 0.35–4.50)

## 2018-05-10 LAB — HEPATIC FUNCTION PANEL
ALT: 16 U/L (ref 0–35)
AST: 14 U/L (ref 0–37)
Albumin: 4.3 g/dL (ref 3.5–5.2)
Alkaline Phosphatase: 53 U/L (ref 39–117)
BILIRUBIN TOTAL: 0.4 mg/dL (ref 0.2–1.2)
Bilirubin, Direct: 0 mg/dL (ref 0.0–0.3)
Total Protein: 7.1 g/dL (ref 6.0–8.3)

## 2018-05-10 NOTE — Progress Notes (Signed)
Subjective:    Patient ID: Lauren Wiley, female    DOB: [birth year], 47 y.o.   MRN: [id number]  HPI Patient presents for yearly preventative medicine examination. She is a pleasant 47 year old female who  has a past medical history of Allergy, Anemia, GERD (gastroesophageal reflux disease), Seasonal allergies, and Uterine fibroid.   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   GERD - Takes Prilosec 20 mg daily   Dysphagia -is being seen by Dr. Fuller Plan of GI.  Recently went endoscopy and dilation of the esophagus on 04/18/2018. She has recovered well.   Anemia - takes OTC iron supplement. Also has had two recent iron infusions.   All immunizations and health maintenance protocols were reviewed with the patient and needed orders were placed.  Appropriate screening laboratory values were ordered for the patient including screening of hyperlipidemia, renal function and hepatic function.  Medication reconciliation,  past medical history, social history, problem list and allergies were reviewed in detail with the patient.   Goals were established with regard to weight loss, exercise, and  diet in compliance with medications. She eats healthy and is very active  Wt Readings from Last 3 Encounters:  05/10/18 144 lb (65.3 kg)  04/18/18 148 lb (67.1 kg)  04/03/18 148 lb 12.8 oz (67.5 kg)   She is up to date on her healthy maintenance items such as mammogram, pap, dental and vision screens.   Review of Systems  Constitutional: Negative.   HENT: Negative.   Eyes: Negative.   Respiratory: Negative.   Cardiovascular: Negative.   Gastrointestinal: Negative.   Endocrine: Negative.   Genitourinary: Negative.   Musculoskeletal: Negative.   Skin: Negative.   Allergic/Immunologic: Negative.   Neurological: Negative.   Hematological: Negative.   Psychiatric/Behavioral: Negative.      Past Medical History:    Diagnosis Date  . Allergy   . Anemia   . GERD (gastroesophageal reflux disease)   . Seasonal allergies   . Uterine fibroid     Social History   Socioeconomic History  . Marital status: Single    Spouse name: Not on file  . Number of children: Not on file  . Years of education: Not on file  . Highest education level: Not on file  Occupational History  . Not on file  Social Needs  . Financial resource strain: Not on file  . Food insecurity:    Worry: Not on file    Inability: Not on file  . Transportation needs:    Medical: Not on file    Non-medical: Not on file  Tobacco Use  . Smoking status: Former Smoker    Last attempt to quit: 11/20/1990    Years since quitting: 27.4  . Smokeless tobacco: Never Used  Substance and Sexual Activity  . Alcohol use: Yes    Alcohol/week: 0.0 oz    Comment: once every 2-3 months  . Drug use: No  . Sexual activity: Yes  Lifestyle  . Physical activity:    Days per week: Not on file    Minutes per session: Not on file  . Stress: Not on file  Relationships  . Social connections:    Talks on phone: Not on file    Gets together: Not on file    Attends religious service: Not on file    Active member of club or organization: Not on file  Attends meetings of clubs or organizations: Not on file    Relationship status: Not on file  . Intimate partner violence:    Fear of current or ex partner: Not on file    Emotionally abused: Not on file    Physically abused: Not on file    Forced sexual activity: Not on file  Other Topics Concern  . 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     Past Surgical History:  Procedure Laterality Date  . none      Family History  Problem Relation Age of Onset  . Arrhythmia Mother   . Diabetes Mellitus II Mother   . Hypertension Mother   . Hypotension Father   . Diabetes Mellitus II Father   .  Heart attack Paternal Uncle   . Arrhythmia Maternal Grandmother   . Diabetes Mellitus II Maternal Grandmother   . Heart attack Paternal Grandmother   . Heart attack Paternal Grandfather   . Liver disease Maternal Grandfather        alcohol related  . Heart failure Neg Hx   . Colon cancer Neg Hx   . Esophageal cancer Neg Hx   . Pancreatic cancer Neg Hx   . Stomach cancer Neg Hx   . Rectal cancer Neg Hx     Allergies  Allergen Reactions  . Augmentin [Amoxicillin-Pot Clavulanate] Other (See Comments)    Throat swelling and soreness     Current Outpatient Medications on File Prior to Visit  Medication Sig Dispense Refill  . ferrous sulfate 325 (65 FE) MG tablet Take 325 mg by mouth daily with breakfast.    . omeprazole (PRILOSEC) 40 MG capsule Take 1 capsule (40 mg total) by mouth daily. 90 capsule 0   Current Facility-Administered Medications on File Prior to Visit  Medication Dose Route Frequency Provider Last Rate Last Dose  . 0.9 %  sodium chloride infusion  500 mL Intravenous Once Lucio Edward T, MD        BP 110/70   Temp 98.6 F (37 C) (Oral)   Ht 5' 5.5" (1.664 m)   Wt 144 lb (65.3 kg)   BMI 23.60 kg/m       Objective:   Physical Exam  Constitutional: She is oriented to person, place, and time. She appears well-developed and well-nourished. No distress.  HENT:  Head: Normocephalic and atraumatic.  Right Ear: External ear normal.  Left Ear: External ear normal.  Nose: Nose normal.  Mouth/Throat: Oropharynx is clear and moist. No oropharyngeal exudate.  Eyes: Pupils are equal, round, and reactive to light. Conjunctivae and EOM are normal. Right eye exhibits no discharge. Left eye exhibits no discharge. No scleral icterus.  Neck: Normal range of motion. Neck supple. No JVD present. No tracheal deviation present. No thyromegaly present.  Cardiovascular: Normal rate, regular rhythm, normal heart sounds and intact distal pulses. Exam reveals no gallop and no  friction rub.  No murmur heard. Pulmonary/Chest: Effort normal and breath sounds normal. No stridor. No respiratory distress. She has no wheezes. She has no rales. She exhibits no tenderness.  Abdominal: Soft. Bowel sounds are normal. She exhibits no distension and no mass. There is no tenderness. There is no rebound and no guarding. No hernia.  Musculoskeletal: Normal range of motion. She exhibits no edema, tenderness or deformity.  Lymphadenopathy:    She has no  cervical adenopathy.  Neurological: She is alert and oriented to person, place, and time. She displays normal reflexes. No cranial nerve deficit or sensory deficit. She exhibits normal muscle tone. Coordination normal.  Skin: Skin is warm and dry. Capillary refill takes less than 2 seconds. No rash noted. She is not diaphoretic. No erythema. No pallor.  Psychiatric: She has a normal mood and affect. Her behavior is normal. Judgment and thought content normal.  Nursing note and vitals reviewed.     Assessment & Plan:  1. Routine general medical examination at a health care facility - Benign exam - Very healthy female  - Continue to diet and exercise  - Wished her well on her new journey  - Basic metabolic panel - CBC with Differential/Platelet - Hepatic function panel - Lipid panel - TSH - Iron, TIBC and Ferritin Panel  2. Iron deficiency anemia, unspecified iron deficiency anemia type  - CBC with Differential/Platelet - Iron, TIBC and Ferritin Panel  3. Gastroesophageal reflux disease with esophagitis - Continue with Prilosec   4. Encounter for screening for HIV  - HIV antibody  Dorothyann Peng, NP

## 2018-05-11 LAB — IRON,TIBC AND FERRITIN PANEL
%SAT: 11 % — AB (ref 16–45)
FERRITIN: 343 ng/mL — AB (ref 16–232)
IRON: 31 ug/dL — AB (ref 40–190)
TIBC: 287 ug/dL (ref 250–450)

## 2018-05-11 LAB — HIV ANTIBODY (ROUTINE TESTING W REFLEX): HIV 1&2 Ab, 4th Generation: NONREACTIVE

## 2018-05-14 ENCOUNTER — Encounter: Payer: BLUE CROSS/BLUE SHIELD | Admitting: Adult Health

## 2018-06-04 DIAGNOSIS — N920 Excessive and frequent menstruation with regular cycle: Secondary | ICD-10-CM | POA: Diagnosis not present

## 2018-06-11 ENCOUNTER — Other Ambulatory Visit: Payer: Self-pay | Admitting: Obstetrics and Gynecology

## 2018-06-11 DIAGNOSIS — D250 Submucous leiomyoma of uterus: Secondary | ICD-10-CM

## 2018-06-12 ENCOUNTER — Other Ambulatory Visit: Payer: Self-pay | Admitting: Obstetrics and Gynecology

## 2018-06-12 ENCOUNTER — Ambulatory Visit
Admission: RE | Admit: 2018-06-12 | Discharge: 2018-06-12 | Disposition: A | Discharge status: Home / Self Care | Payer: BLUE CROSS/BLUE SHIELD | Source: Ambulatory Visit | Attending: Obstetrics and Gynecology | Admitting: Obstetrics and Gynecology

## 2018-06-12 DIAGNOSIS — D259 Leiomyoma of uterus, unspecified: Secondary | ICD-10-CM | POA: Diagnosis not present

## 2018-06-12 DIAGNOSIS — D250 Submucous leiomyoma of uterus: Secondary | ICD-10-CM

## 2018-06-12 DIAGNOSIS — N920 Excessive and frequent menstruation with regular cycle: Secondary | ICD-10-CM | POA: Diagnosis not present

## 2018-06-12 HISTORY — DX: Benign neoplasm of connective and other soft tissue, unspecified: D21.9

## 2018-06-12 HISTORY — PX: IR RADIOLOGIST EVAL & MGMT: IMG5224

## 2018-06-12 NOTE — Consult Note (Signed)
Chief Complaint: Patient was seen in consultation today for  Chief Complaint  Patient presents with  . Consult    Consult for [place]   at the request of Cousins,Sheronette  Referring Physician(s): Cousins,Sheronette  History of Present Illness: Lauren Wiley is a 47 y.o. female who presents at the kind request of Dr. Garwin Brothers to discuss uterine artery embolization.  She has a known history of uterine fibroids which are symptomatic.  Her primary symptom is menorrhagia.  Her periods are regular and occur once per month and last for approximately 7 days.  The first several days are extremely heavy with passage of clots.  During her heavy days she often has to change her super absorbent tampons as frequently as every 30 minutes.  This is disruptive to her active lifestyle.  Additionally, she was recently diagnosed with anemia.  Her hemoglobin was 9.9 and she underwent iron transfusion.  She denies severe cramping but does endorse some bulk related symptoms in her pelvis.  She has had a recent Pap smears and endometrial biopsies which were both negative for evidence of malignancy.  She has tried over-the-counter birth control in the past and was unable to tolerate it very well.  She denies current symptoms of urinary tract infection, chest pain, shortness of breath, fatigue, dry skin or other symptoms of hypothyroidism.  Past Medical History:  Diagnosis Date  . Allergy   . Anemia   . Fibroids   . GERD (gastroesophageal reflux disease)   . Seasonal allergies   . Uterine fibroid     Past Surgical History:  Procedure Laterality Date  . none      Allergies: Augmentin [amoxicillin-pot clavulanate]  Medications: Prior to Admission medications   Medication Sig Start Date End Date Taking? Authorizing Provider  ferrous sulfate 325 (65 FE) MG tablet Take 325 mg by mouth daily with breakfast.   Yes [provider]  omeprazole (PRILOSEC) 40 MG capsule Take 1 capsule (40 mg  total) by mouth daily. 02/05/18  Yes Nafziger, Tommi Rumps, NP     Family History  Problem Relation Age of Onset  . Arrhythmia Mother   . Diabetes Mellitus II Mother   . Hypertension Mother   . Hypotension Father   . Diabetes Mellitus II Father   . Heart attack Paternal Uncle   . Arrhythmia Maternal Grandmother   . Diabetes Mellitus II Maternal Grandmother   . Heart attack Paternal Grandmother   . Heart attack Paternal Grandfather   . Liver disease Maternal Grandfather        alcohol related  . Heart failure Neg Hx   . Colon cancer Neg Hx   . Esophageal cancer Neg Hx   . Pancreatic cancer Neg Hx   . Stomach cancer Neg Hx   . Rectal cancer Neg Hx     Social History   Socioeconomic History  . Marital status: Single    Spouse name: Not on file  . Number of children: Not on file  . Years of education: Not on file  . Highest education level: Not on file  Occupational History  . Not on file  Social Needs  . Financial resource strain: Not on file  . Food insecurity:    Worry: Not on file    Inability: Not on file  . Transportation needs:    Medical: Not on file    Non-medical: Not on file  Tobacco Use  . Smoking status: Former Smoker    Last attempt to quit: 11/20/1990  Years since quitting: 27.5  . Smokeless tobacco: Never Used  Substance and Sexual Activity  . Alcohol use: Yes    Alcohol/week: 0.0 oz    Comment: once every 2-3 months  . Drug use: No  . Sexual activity: Yes  Lifestyle  . Physical activity:    Days per week: Not on file    Minutes per session: Not on file  . Stress: Not on file  Relationships  . Social connections:    Talks on phone: Not on file    Gets together: Not on file    Attends religious service: Not on file    Active member of club or organization: Not on file    Attends meetings of clubs or organizations: Not on file    Relationship status: Not on file  Other Topics Concern  . Not on file  Social History Narrative   She is a Paramedic at [employer]    Married for 5 years   - starting adoption process.     Review of Systems: A 12 point ROS discussed and pertinent positives are indicated in the HPI above.  All other systems are negative.  Review of Systems  Vital Signs: BP 98/64   Pulse 82   Temp 98.1 F (36.7 C) (Oral)   Resp 14   Ht 5' 5.5" (1.664 m)   Wt 151 lb (68.5 kg)   LMP 05/12/2018 (Approximate)   SpO2 100%   BMI 24.75 kg/m   Physical Exam  Constitutional: She appears well-developed and well-nourished. No distress.  HENT:  Head: Normocephalic and atraumatic.  Eyes: No scleral icterus.  Neck: No thyromegaly present.  Cardiovascular: Normal rate and regular rhythm.  Pulses:      Radial pulses are 2+ on the right side, and 2+ on the left side.       Femoral pulses are 2+ on the right side, and 2+ on the left side. Pulmonary/Chest: Effort normal.  Nursing note and vitals reviewed.    Imaging: No results found.  Labs:  CBC: Recent Labs    05/10/18 1000  WBC 4.4  HGB 12.4  HCT 38.4  PLT 142.0*    COAGS: No results for input(s): INR, APTT in the last 8760 hours.  BMP: Recent Labs    05/10/18 1000  NA 136  K 3.8  CL 104  CO2 25  GLUCOSE 91  BUN 9  CALCIUM 9.0  CREATININE 0.62    LIVER FUNCTION TESTS: Recent Labs    05/10/18 1000  BILITOT 0.4  AST 14  ALT 16  ALKPHOS 53  PROT 7.1  ALBUMIN 4.3    TUMOR MARKERS: No results for input(s): AFPTM, CEA, CA199, CHROMGRNA in the last 8760 hours.  Assessment and Plan:  Very pleasant 47 year old female with symptomatic uterine fibroids.  Her primary symptom is menorrhagia with associated anemia requiring iron supplementation.  We discussed the risks, benefits and alternatives to uterine artery embolization.  Clinically, she appears to be a good candidate for the procedure and she has a high level of interest.  The only complicating factor is her upcoming move to [place].  She would like to go home and  discuss the options with her husband and then she will call us if she would like to proceed with the MRI of the pelvis with gadolinium.  Thank you for this interesting consult.  I greatly enjoyed meeting Lauren Wiley and look forward to participating in their care.  A copy of this  report was sent to the requesting provider on this date.  Electronically Signed: Jacqulynn Cadet 06/12/2018, 10:23 AM   I spent a total of  40 Minutes  in face to face in clinical consultation, greater than 50% of which was counseling/coordinating care for symptomatic uterine fibroids.

## 2018-10-14 IMAGING — US US PELVIS COMPLETE TRANSABD/TRANSVAG
1 series · 13 of 25 positions shown · non-contrast
Comparison: None

CLINICAL DATA: Menorrhagia with regular menstrual cycle

EXAM:
TRANSABDOMINAL AND TRANSVAGINAL ULTRASOUND OF PELVIS
TECHNIQUE: Both transabdominal and transvaginal ultrasound examinations of the
pelvis were performed. Transabdominal technique was performed for
global imaging of the pelvis including uterus, ovaries, adnexal
regions, and pelvic cul-de-sac. It was necessary to proceed with
endovaginal exam following the transabdominal exam to visualize the
endometrium and ovaries.

[Series 1: us pelvis complete transabd/transvag · 0.25mm/px · 13 of 63 slices shown]
[im 1/63]
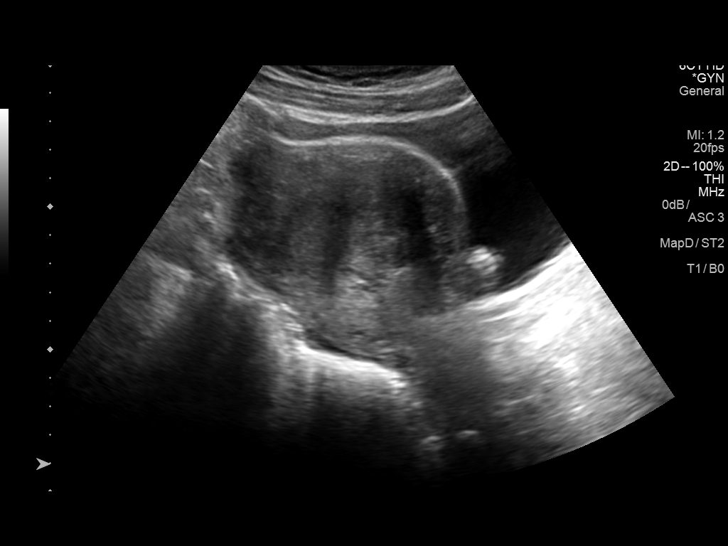
[im 6/63]
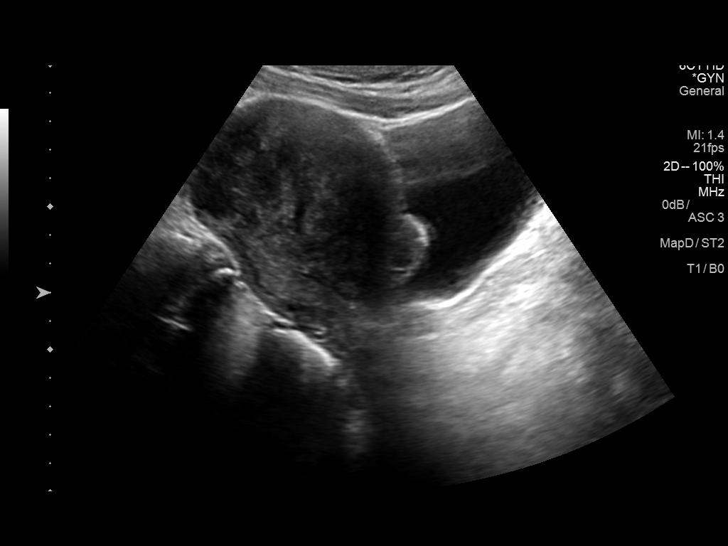
[im 11/63]
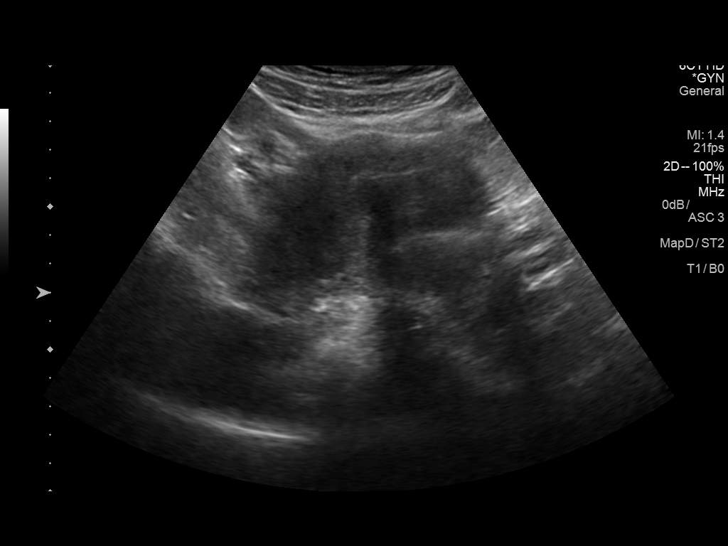
[im 16/63]
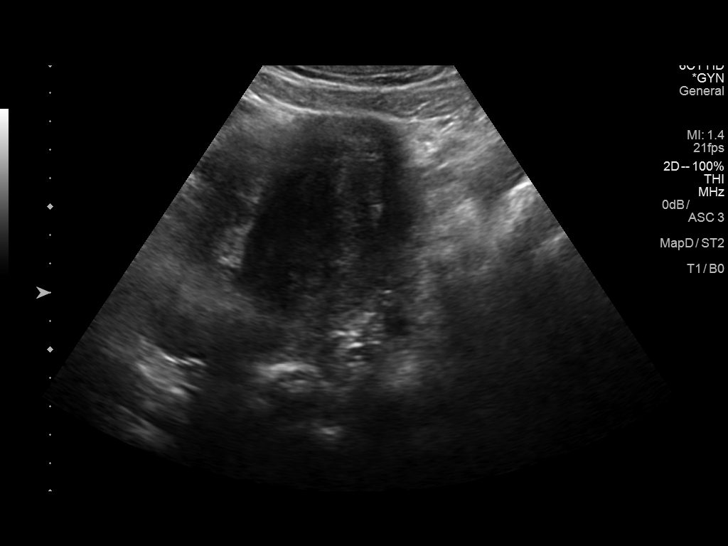
[im 21/63]
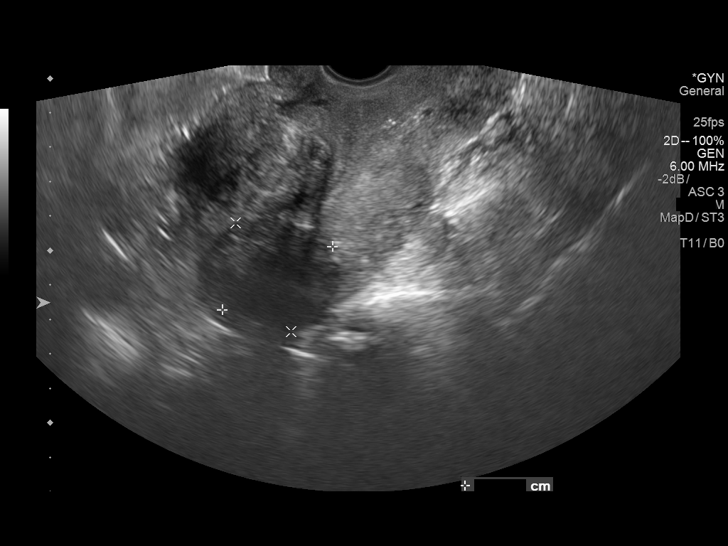
[im 26/63]
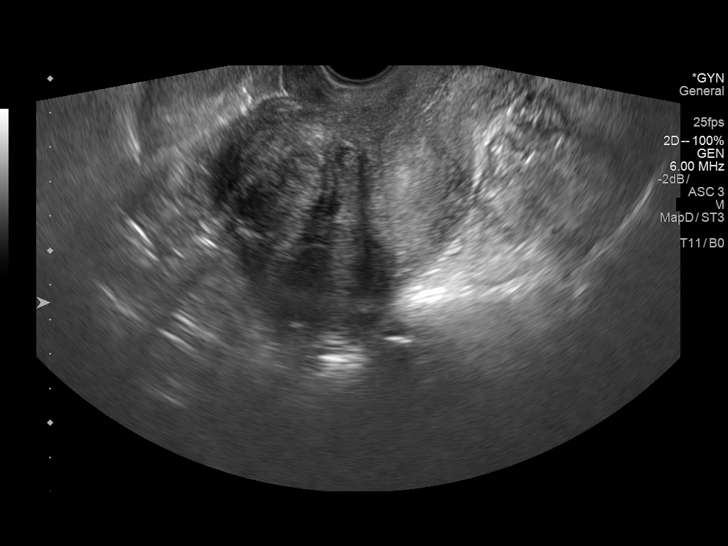
[im 32/63]
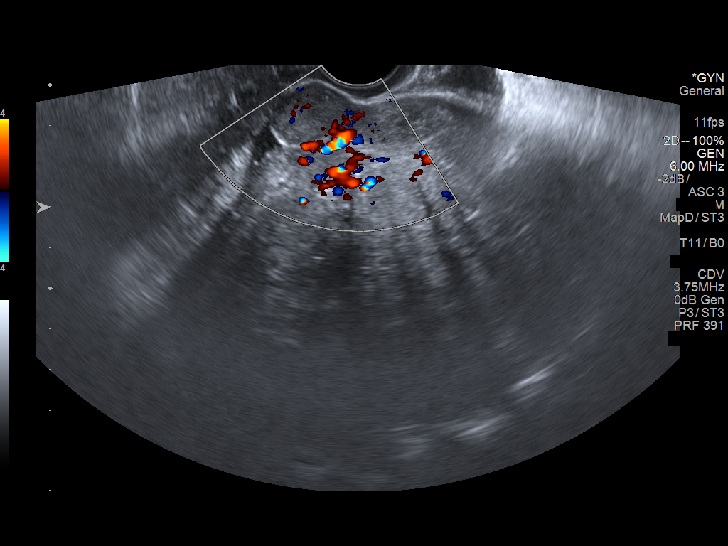
[im 37/63]
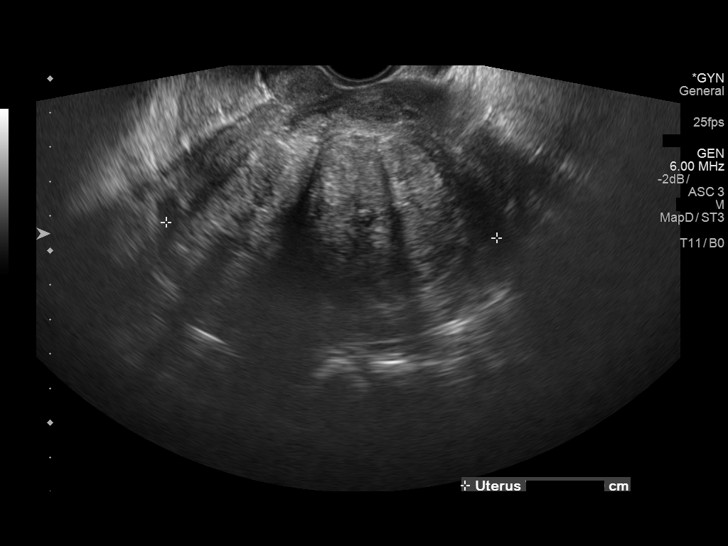
[im 42/63]
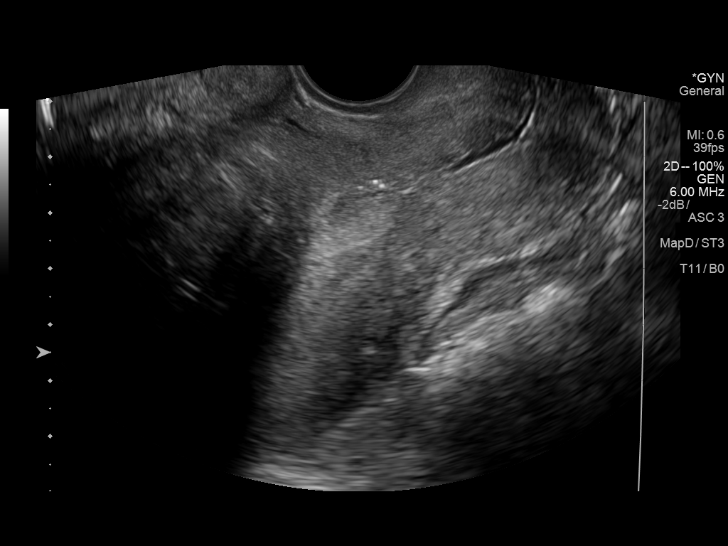
[im 47/63]
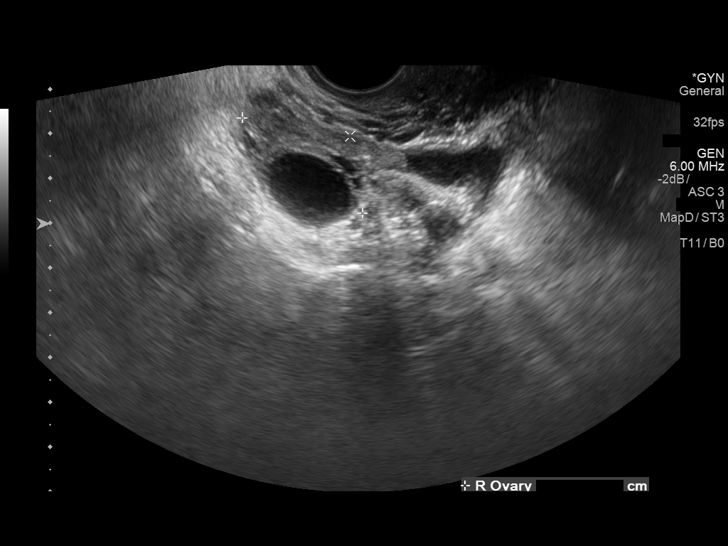
[im 52/63]
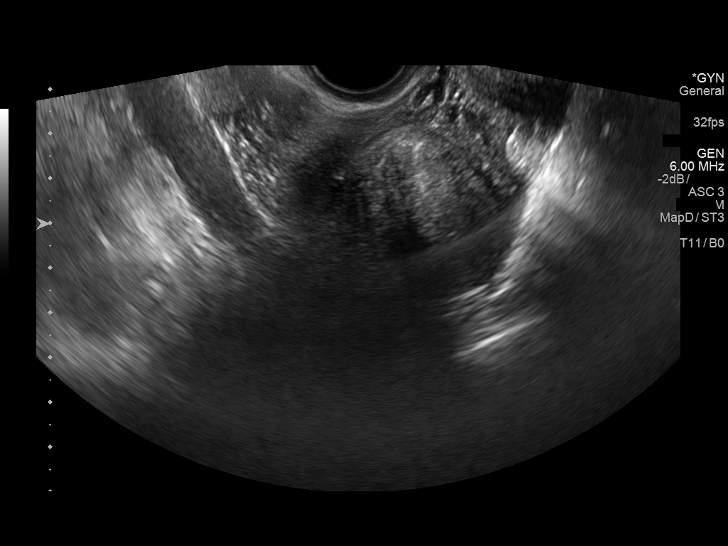
[im 57/63]
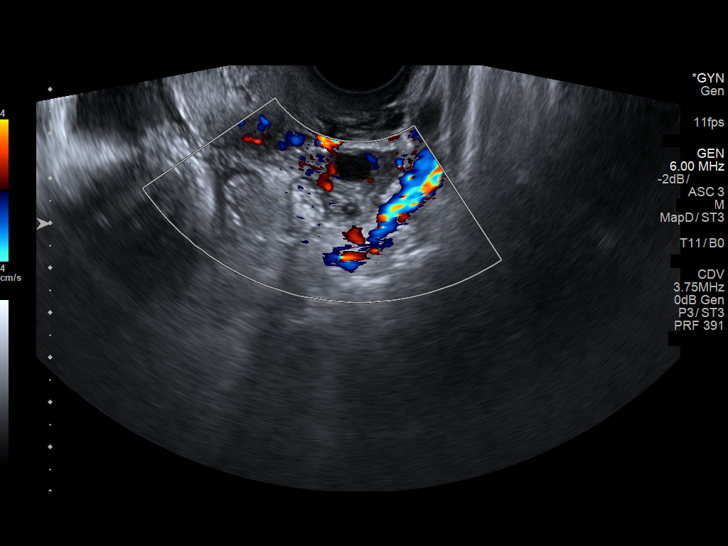
[im 63/63]
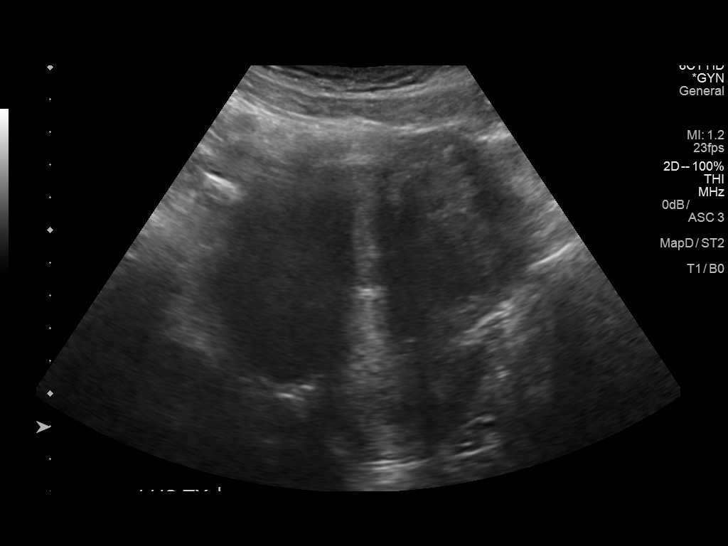

[13 of 25 positions shown; findings below may reference images not displayed]

FINDINGS: Uterus

Measurements: 11.7 x 6.8 x 9.7 cm. Multiple nodular foci compatible
with leiomyomata. These include a large leiomyoma at the fundus
x 5.4 x 5.8 cm, small RIGHT anterolateral subserosal leiomyomas
measuring 3.7 x 3.5 x 3.0 cm and 3.7 x 2.8 x 3.2 cm.

Endometrium

Inadequately visualized due to distortion and shadowing resulting
from uterine fibroids.

Right ovary

Measurements: 3.4 x 2.6 x 1.7 cm.  Normal morphology without mass

Left ovary

Measurements: 2.4 x 1.3 x 2.0 cm.  Normal morphology without mass

Other findings

No free pelvic fluid or adnexal masses.
IMPRESSION: Multiple uterine leiomyomata.

Nonvisualization of the endometrial complex due to distortion and
shadowing resulting from uterine leiomyomata.

Unremarkable ovaries.

## 2018-11-11 ENCOUNTER — Other Ambulatory Visit: Payer: Self-pay | Admitting: Obstetrics and Gynecology

## 2018-11-11 DIAGNOSIS — D250 Submucous leiomyoma of uterus: Secondary | ICD-10-CM

## 2018-11-23 ENCOUNTER — Ambulatory Visit
Admission: RE | Admit: 2018-11-23 | Discharge: 2018-11-23 | Disposition: A | Discharge status: Home / Self Care | Payer: BLUE CROSS/BLUE SHIELD | Source: Ambulatory Visit | Attending: Obstetrics and Gynecology | Admitting: Obstetrics and Gynecology

## 2018-11-23 DIAGNOSIS — D251 Intramural leiomyoma of uterus: Secondary | ICD-10-CM | POA: Diagnosis not present

## 2018-11-23 DIAGNOSIS — D250 Submucous leiomyoma of uterus: Secondary | ICD-10-CM

## 2018-11-23 DIAGNOSIS — D252 Subserosal leiomyoma of uterus: Secondary | ICD-10-CM | POA: Diagnosis not present

## 2018-11-23 MED ORDER — GADOBENATE DIMEGLUMINE 529 MG/ML IV SOLN
14.0000 mL | Freq: Once | INTRAVENOUS | Status: AC | PRN
  Administered 2018-11-23: 14 mL via INTRAVENOUS

## 2018-12-04 ENCOUNTER — Other Ambulatory Visit (HOSPITAL_COMMUNITY): Payer: Self-pay | Admitting: Interventional Radiology

## 2018-12-04 DIAGNOSIS — D259 Leiomyoma of uterus, unspecified: Secondary | ICD-10-CM

## 2019-01-16 ENCOUNTER — Other Ambulatory Visit: Payer: Self-pay | Admitting: Radiology

## 2019-01-17 ENCOUNTER — Observation Stay (HOSPITAL_COMMUNITY)
Admission: RE | Admit: 2019-01-17 | Discharge: 2019-01-18 | Disposition: A | Discharge status: Home / Self Care | Payer: BLUE CROSS/BLUE SHIELD | Source: Ambulatory Visit | Attending: Interventional Radiology | Admitting: Interventional Radiology

## 2019-01-17 ENCOUNTER — Ambulatory Visit (HOSPITAL_COMMUNITY)
Admission: RE | Admit: 2019-01-17 | Discharge: 2019-01-17 | Disposition: A | Discharge status: Home / Self Care | Payer: BLUE CROSS/BLUE SHIELD | Source: Ambulatory Visit | Attending: Interventional Radiology | Admitting: Interventional Radiology

## 2019-01-17 ENCOUNTER — Encounter (HOSPITAL_COMMUNITY): Payer: Self-pay

## 2019-01-17 ENCOUNTER — Other Ambulatory Visit: Payer: Self-pay

## 2019-01-17 DIAGNOSIS — N920 Excessive and frequent menstruation with regular cycle: Secondary | ICD-10-CM | POA: Diagnosis not present

## 2019-01-17 DIAGNOSIS — D259 Leiomyoma of uterus, unspecified: Principal | ICD-10-CM | POA: Insufficient documentation

## 2019-01-17 DIAGNOSIS — D649 Anemia, unspecified: Secondary | ICD-10-CM | POA: Insufficient documentation

## 2019-01-17 DIAGNOSIS — K219 Gastro-esophageal reflux disease without esophagitis: Secondary | ICD-10-CM | POA: Diagnosis not present

## 2019-01-17 DIAGNOSIS — Z79899 Other long term (current) drug therapy: Secondary | ICD-10-CM | POA: Diagnosis not present

## 2019-01-17 DIAGNOSIS — Z881 Allergy status to other antibiotic agents status: Secondary | ICD-10-CM | POA: Insufficient documentation

## 2019-01-17 HISTORY — PX: IR US GUIDE VASC ACCESS LEFT: IMG2389

## 2019-01-17 HISTORY — PX: IR FLUORO GUIDED NEEDLE PLC ASPIRATION/INJECTION LOC: IMG2395

## 2019-01-17 HISTORY — PX: IR ANGIOGRAM SELECTIVE EACH ADDITIONAL VESSEL: IMG667

## 2019-01-17 HISTORY — PX: IR EMBO TUMOR ORGAN ISCHEMIA INFARCT INC GUIDE ROADMAPPING: IMG5449

## 2019-01-17 HISTORY — PX: IR ANGIOGRAM PELVIS SELECTIVE OR SUPRASELECTIVE: IMG661

## 2019-01-17 LAB — BASIC METABOLIC PANEL
Anion gap: 8 (ref 5–15)
BUN: 9 mg/dL (ref 6–20)
CO2: 25 mmol/L (ref 22–32)
Calcium: 9 mg/dL (ref 8.9–10.3)
Chloride: 105 mmol/L (ref 98–111)
Creatinine, Ser: 0.59 mg/dL (ref 0.44–1.00)
GFR calc Af Amer: >60 mL/min (ref >60)
GFR calc non Af Amer: >60 mL/min (ref >60)
Glucose, Bld: 94 mg/dL (ref 70–99)
POTASSIUM: 3.8 mmol/L (ref 3.5–5.1)
Sodium: 138 mmol/L (ref 135–145)

## 2019-01-17 LAB — CBC
HCT: 38.8 % (ref 36.0–46.0)
Hemoglobin: 11.8 g/dL — ABNORMAL LOW (ref 12.0–15.0)
MCH: 25.5 pg — ABNORMAL LOW (ref 26.0–34.0)
MCHC: 30.4 g/dL (ref 30.0–36.0)
MCV: 84 fL (ref 80.0–100.0)
NRBC: 0 % (ref 0.0–0.2)
PLATELETS: 197 10*3/uL (ref 150–400)
RBC: 4.62 MIL/uL (ref 3.87–5.11)
RDW: 15.3 % (ref 11.5–15.5)
WBC: 3.1 10*3/uL — ABNORMAL LOW (ref 4.0–10.5)

## 2019-01-17 LAB — PROTIME-INR
INR: 0.9 (ref 0.8–1.2)
Prothrombin Time: 11.6 seconds (ref 11.4–15.2)

## 2019-01-17 LAB — HCG, SERUM, QUALITATIVE: Preg, Serum: NEGATIVE

## 2019-01-17 MED ORDER — ROPIVACAINE HCL 5 MG/ML IJ SOLN
INTRAMUSCULAR | Status: AC
  Filled 2019-01-17: qty 30

## 2019-01-17 MED ORDER — FENTANYL CITRATE (PF) 100 MCG/2ML IJ SOLN
INTRAMUSCULAR | Status: AC
  Filled 2019-01-17: qty 6

## 2019-01-17 MED ORDER — KETOROLAC TROMETHAMINE 30 MG/ML IJ SOLN
INTRAMUSCULAR | Status: AC
  Filled 2019-01-17: qty 1

## 2019-01-17 MED ORDER — LIDOCAINE HCL (PF) 1 % IJ SOLN
INTRAMUSCULAR | Status: AC | PRN
  Administered 2019-01-17 (×2): 1 mL

## 2019-01-17 MED ORDER — SODIUM CHLORIDE 0.9 % IV SOLN
INTRAVENOUS | Status: DC
  Administered 2019-01-17: 09:00:00 via INTRAVENOUS

## 2019-01-17 MED ORDER — OXYCODONE-ACETAMINOPHEN 5-325 MG PO TABS
1.0000 | ORAL_TABLET | ORAL | Status: DC
  Administered 2019-01-17 (×3): 1 via ORAL
  Filled 2019-01-17 (×3): qty 1

## 2019-01-17 MED ORDER — DEXAMETHASONE SODIUM PHOSPHATE 10 MG/ML IJ SOLN
8.0000 mg | Freq: Once | INTRAMUSCULAR | Status: AC
  Administered 2019-01-17: 8 mg via INTRAVENOUS
  Filled 2019-01-17: qty 1

## 2019-01-17 MED ORDER — OXYCODONE-ACETAMINOPHEN 5-325 MG PO TABS
1.0000 | ORAL_TABLET | ORAL | Status: DC
  Administered 2019-01-17: 1 via ORAL
  Administered 2019-01-18 (×3): 2 via ORAL
  Filled 2019-01-17: qty 1
  Filled 2019-01-17 (×3): qty 2

## 2019-01-17 MED ORDER — SODIUM CHLORIDE 0.9% FLUSH: 3.0000 mL | INTRAVENOUS | Status: DC | PRN

## 2019-01-17 MED ORDER — VANCOMYCIN HCL IN DEXTROSE 1-5 GM/200ML-% IV SOLN
INTRAVENOUS | Status: AC
  Administered 2019-01-17: 1000 mg via INTRAVENOUS
  Filled 2019-01-17: qty 200

## 2019-01-17 MED ORDER — NAPROXEN SODIUM 275 MG PO TABS
550.0000 mg | ORAL_TABLET | Freq: Two times a day (BID) | ORAL | Status: AC
  Administered 2019-01-17 – 2019-01-18 (×2): 550 mg via ORAL
  Filled 2019-01-17 (×2): qty 2

## 2019-01-17 MED ORDER — IOHEXOL 300 MG/ML  SOLN
100.0000 mL | Freq: Once | INTRAMUSCULAR | Status: AC | PRN
  Administered 2019-01-17: 50 mL via INTRA_ARTERIAL

## 2019-01-17 MED ORDER — MIDAZOLAM HCL 2 MG/2ML IJ SOLN
INTRAMUSCULAR | Status: AC
  Filled 2019-01-17: qty 6

## 2019-01-17 MED ORDER — MIDAZOLAM HCL 2 MG/2ML IJ SOLN
INTRAMUSCULAR | Status: AC
  Filled 2019-01-17: qty 2

## 2019-01-17 MED ORDER — VERAPAMIL HCL 2.5 MG/ML IV SOLN
INTRA_ARTERIAL | Status: AC | PRN
  Administered 2019-01-17: 13:00:00 via INTRA_ARTERIAL

## 2019-01-17 MED ORDER — KETOROLAC TROMETHAMINE 30 MG/ML IJ SOLN
30.0000 mg | INTRAMUSCULAR | Status: AC
  Administered 2019-01-17: 30 mg via INTRAVENOUS
  Filled 2019-01-17: qty 1

## 2019-01-17 MED ORDER — SODIUM CHLORIDE 0.9% FLUSH
3.0000 mL | Freq: Two times a day (BID) | INTRAVENOUS | Status: DC
  Administered 2019-01-18: 3 mL via INTRAVENOUS

## 2019-01-17 MED ORDER — LIDOCAINE HCL 1 % IJ SOLN
INTRAMUSCULAR | Status: AC
  Filled 2019-01-17: qty 20

## 2019-01-17 MED ORDER — OXYCODONE-ACETAMINOPHEN 5-325 MG PO TABS: 2.0000 | ORAL_TABLET | ORAL | Status: DC

## 2019-01-17 MED ORDER — SODIUM CHLORIDE 0.9 % IV SOLN
INTRAVENOUS | Status: AC
  Administered 2019-01-17 – 2019-01-18 (×2): via INTRAVENOUS

## 2019-01-17 MED ORDER — ONDANSETRON HCL 4 MG/2ML IJ SOLN
4.0000 mg | Freq: Four times a day (QID) | INTRAMUSCULAR | Status: DC | PRN
  Administered 2019-01-17 – 2019-01-18 (×2): 4 mg via INTRAVENOUS
  Filled 2019-01-17 (×2): qty 2

## 2019-01-17 MED ORDER — SODIUM CHLORIDE 0.9 % IV SOLN: 250.0000 mL | INTRAVENOUS | Status: DC | PRN

## 2019-01-17 MED ORDER — VERAPAMIL HCL 2.5 MG/ML IV SOLN
INTRAVENOUS | Status: AC
  Filled 2019-01-17: qty 2

## 2019-01-17 MED ORDER — HEPARIN SODIUM (PORCINE) 1000 UNIT/ML IJ SOLN
INTRAMUSCULAR | Status: AC
  Filled 2019-01-17: qty 1

## 2019-01-17 MED ORDER — NITROGLYCERIN IN D5W 100-5 MCG/ML-% IV SOLN
INTRAVENOUS | Status: AC
  Filled 2019-01-17: qty 250

## 2019-01-17 MED ORDER — MIDAZOLAM HCL 2 MG/2ML IJ SOLN
INTRAMUSCULAR | Status: AC | PRN
  Administered 2019-01-17: 1 mg via INTRAVENOUS
  Administered 2019-01-17 (×2): 0.5 mg via INTRAVENOUS
  Administered 2019-01-17 (×2): 1 mg via INTRAVENOUS
  Administered 2019-01-17: 0.5 mg via INTRAVENOUS
  Administered 2019-01-17 (×2): 1 mg via INTRAVENOUS
  Administered 2019-01-17: 0.5 mg via INTRAVENOUS

## 2019-01-17 MED ORDER — IOHEXOL 300 MG/ML  SOLN
100.0000 mL | Freq: Once | INTRAMUSCULAR | Status: AC | PRN
  Administered 2019-01-17: 75 mL via INTRA_ARTERIAL

## 2019-01-17 MED ORDER — VANCOMYCIN HCL IN DEXTROSE 1-5 GM/200ML-% IV SOLN
1000.0000 mg | INTRAVENOUS | Status: AC
  Administered 2019-01-17: 1000 mg via INTRAVENOUS

## 2019-01-17 MED ORDER — FENTANYL 25 MCG/HR TD PT72
1.0000 | MEDICATED_PATCH | TRANSDERMAL | Status: DC
  Administered 2019-01-17: 1 via TRANSDERMAL
  Filled 2019-01-17: qty 1

## 2019-01-17 MED ORDER — FENTANYL CITRATE (PF) 100 MCG/2ML IJ SOLN
INTRAMUSCULAR | Status: AC | PRN
  Administered 2019-01-17: 50 ug via INTRAVENOUS
  Administered 2019-01-17: 25 ug via INTRAVENOUS
  Administered 2019-01-17: 50 ug via INTRAVENOUS
  Administered 2019-01-17: 25 ug via INTRAVENOUS

## 2019-01-17 MED ORDER — PANTOPRAZOLE SODIUM 40 MG PO TBEC
40.0000 mg | DELAYED_RELEASE_TABLET | Freq: Every day | ORAL | Status: DC
  Administered 2019-01-17 – 2019-01-18 (×2): 40 mg via ORAL
  Filled 2019-01-17 (×2): qty 1

## 2019-01-17 MED ORDER — PROMETHAZINE HCL 25 MG RE SUPP: 25.0000 mg | Freq: Three times a day (TID) | RECTAL | Status: DC | PRN

## 2019-01-17 MED ORDER — PROMETHAZINE HCL 25 MG PO TABS
25.0000 mg | ORAL_TABLET | Freq: Three times a day (TID) | ORAL | Status: DC | PRN
  Administered 2019-01-17: 25 mg via ORAL
  Filled 2019-01-17: qty 1

## 2019-01-17 MED ORDER — ROPIVACAINE HCL 5 MG/ML IJ SOLN
INTRAMUSCULAR | Status: AC | PRN
  Administered 2019-01-17: 20 mL

## 2019-01-17 MED ORDER — KETOROLAC TROMETHAMINE 30 MG/ML IJ SOLN
INTRAMUSCULAR | Status: AC | PRN
  Administered 2019-01-17: 30 mg via INTRAVENOUS

## 2019-01-17 MED ORDER — DOCUSATE SODIUM 100 MG PO CAPS
100.0000 mg | ORAL_CAPSULE | Freq: Two times a day (BID) | ORAL | Status: DC
  Administered 2019-01-17 – 2019-01-18 (×2): 100 mg via ORAL
  Filled 2019-01-17 (×2): qty 1

## 2019-01-17 NOTE — Progress Notes (Signed)
Patient is c/o pain in her anus/rectum  pain and across pelvis and round perineal area.  Sharp shooting pain. 1 percocet given.  Unable to reach on call MD.

## 2019-01-17 NOTE — Progress Notes (Signed)
PHARMACY NOTE  Spoke with Dr. Laurence Ferrari to discuss the use of fentanyl patch in an opioid-naive patient for post-procedural pain.   He indicated that this is now an accepted analgesic therapy for this procedure due to the prolonged intense pain that predictably follows the intervention.  He also noted that the patient is remaining hospitalized overnight providing an opportunity to detect any oversedation that occurs during that time.   Clayburn Pert, PharmD, BCPS [phone] 01/17/2019  9:49 AM

## 2019-01-17 NOTE — Progress Notes (Signed)
Patient ID: Lauren Wiley, female   DOB: May 31, 1971, 48 y.o.   MRN: [id number] Pt doing well; only reports mild rt pelvic cramping; denies N/V, resp difficulties; has had crackers/liquids VSS; AF Awake, alert.  Left radial artery access site soft, clean, dry, nontender, no hematoma; intact pulses; no paresthesias Puncture site anterior mid pelvic region for superior hypogastric nerve block clean, dry, nontender, no hematoma.  A/P: Patient with history of symptomatic uterine fibroids, status post successful superior hypogastric nerve block along with technically successful bilateral uterine artery embolization earlier today.  For overnight observation for pain control.  Follow-up with Dr. Laurence Ferrari in the [facility] in 4 weeks.   Rowe Robert, [facility]

## 2019-01-17 NOTE — H&P (Signed)
Referring Physician(s): Cousins,S  Supervising Physician: Jacqulynn Cadet  Patient Status:  [facility] OP TBA  Chief Complaint: Symptomatic uterine fibroids   Subjective: Patient familiar to IR service from consultation with Dr. Laurence Ferrari on 06/12/2018 to discuss treatment options for symptomatic uterine fibroids.  She was deemed an appropriate candidate for bilateral uterine artery embolization and presents today for the procedure.  She currently denies fever, headache, chest pain, dyspnea, cough, abdominal pain, nausea, vomiting.  She does have low back pain, menorrhagia, and occasional palpitations.  Past Medical History:  Diagnosis Date  . Allergy   . Anemia   . Fibroids   . GERD (gastroesophageal reflux disease)   . Seasonal allergies   . Uterine fibroid    Past Surgical History:  Procedure Laterality Date  . IR RADIOLOGIST EVAL & MGMT  06/12/2018  . none        Allergies: Augmentin [amoxicillin-pot clavulanate]  Medications: Prior to Admission medications   Medication Sig Start Date End Date Taking? Authorizing Provider  ferrous sulfate 325 (65 FE) MG tablet Take 325 mg by mouth daily with breakfast.    [provider]  omeprazole (PRILOSEC) 40 MG capsule Take 1 capsule (40 mg total) by mouth daily. 02/05/18   Dorothyann Peng, NP     Vital Signs: Blood pressure 115/73, heart rate 86, respirations 18, temperature 97.9, O2 sat 99% room air    Physical Exam awake, alert.  Chest clear to auscultation bilaterally.  Heart with regular rate and rhythm.  Abdomen soft, positive bowel sounds, currently nontender.  No lower extremity edema, intact distal pulses. Left radial pulse 2+.  Imaging: No results found.  Labs:  CBC: Recent Labs    05/10/18 1000 01/17/19 0834  WBC 4.4 3.1*  HGB 12.4 11.8*  HCT 38.4 38.8  PLT 142.0* 197    COAGS: No results for input(s): INR, APTT in the last 8760 hours.  BMP: Recent Labs    05/10/18 1000  NA 136  K 3.8    CL 104  CO2 25  GLUCOSE 91  BUN 9  CALCIUM 9.0  CREATININE 0.62    LIVER FUNCTION TESTS: Recent Labs    05/10/18 1000  BILITOT 0.4  AST 14  ALT 16  ALKPHOS 53  PROT 7.1  ALBUMIN 4.3    Assessment and Plan: Patient with history of symptomatic uterine fibroids, seen in consultation by Dr. Laurence Ferrari on 06/12/2018 to discuss treatment options and deemed an appropriate candidate for bilateral uterine artery embolization. She presents today for the procedure. Risks and benefits of procedure were discussed with the patient including, but not limited to bleeding, infection, vascular injury or contrast induced renal failure.  This interventional procedure involves the use of X-rays and because of the nature of the planned procedure, it is possible that we will have prolonged use of X-ray fluoroscopy.  Potential radiation risks to you include (but are not limited to) the following: - A slightly elevated risk for cancer  several years later in life. This risk is typically less than 0.5% percent. This risk is low in comparison to the normal incidence of human cancer, which is 33% for women and 50% for men according to the [place]. - Radiation induced injury can include skin redness, resembling a rash, tissue breakdown / ulcers and hair loss (which can be temporary or permanent).   The likelihood of either of these occurring depends on the difficulty of the procedure and whether you are sensitive to radiation due to  previous procedures, disease, or genetic conditions.   IF your procedure requires a prolonged use of radiation, you will be notified and given written instructions for further action.  It is your responsibility to monitor the irradiated area for the 2 weeks following the procedure and to notify your physician if you are concerned that you have suffered a radiation induced injury.    All of the patient's questions were answered, patient is agreeable to  proceed.  Consent signed and in chart.  Post procedure she will be admitted for overnight observation for pain control.  LABS PENDING  Electronically Signed: D. Rowe Robert, PA-C 01/17/2019, 8:48 AM   I spent a total of 30 minutes at the the patient's bedside AND on the patient's hospital floor or unit, greater than 50% of which was counseling/coordinating care for bilateral uterine artery embolization

## 2019-01-17 NOTE — Progress Notes (Signed)
3cc of air removed from TR band at 1307, 1330, 1416, and 1440. TR band removed at 1507. Site is clean dry and intact. Will continue to monitor.

## 2019-01-17 NOTE — Procedures (Signed)
Interventional Radiology Procedure Note  Procedure: Superior hypogastric nerve block and bilateral uterine artery embolization  Vascular Access: Left radial  Complications: None  Estimated Blood Loss: None  Recommendations: - Admit for obs - Hydrate - ADAT - Pain regimen of scheduled percocet, anaprox and fentanyl patch  Signed,  Criselda Peaches, MD

## 2019-01-18 ENCOUNTER — Other Ambulatory Visit (HOSPITAL_COMMUNITY): Payer: Self-pay | Admitting: Physician Assistant

## 2019-01-18 ENCOUNTER — Other Ambulatory Visit: Payer: Self-pay | Admitting: Physician Assistant

## 2019-01-18 DIAGNOSIS — D259 Leiomyoma of uterus, unspecified: Secondary | ICD-10-CM | POA: Diagnosis not present

## 2019-01-18 DIAGNOSIS — Z79899 Other long term (current) drug therapy: Secondary | ICD-10-CM | POA: Diagnosis not present

## 2019-01-18 DIAGNOSIS — Z9889 Other specified postprocedural states: Secondary | ICD-10-CM | POA: Diagnosis not present

## 2019-01-18 DIAGNOSIS — N920 Excessive and frequent menstruation with regular cycle: Secondary | ICD-10-CM | POA: Diagnosis not present

## 2019-01-18 DIAGNOSIS — K219 Gastro-esophageal reflux disease without esophagitis: Secondary | ICD-10-CM | POA: Diagnosis not present

## 2019-01-18 DIAGNOSIS — D649 Anemia, unspecified: Secondary | ICD-10-CM | POA: Diagnosis not present

## 2019-01-18 DIAGNOSIS — Z881 Allergy status to other antibiotic agents status: Secondary | ICD-10-CM | POA: Diagnosis not present

## 2019-01-18 MED ORDER — OXYCODONE-ACETAMINOPHEN 5-325 MG PO TABS: 1.0000 | ORAL_TABLET | ORAL | 0 refills | Status: AC

## 2019-01-18 MED ORDER — ONDANSETRON HCL 4 MG PO TABS: 4.0000 mg | ORAL_TABLET | Freq: Three times a day (TID) | ORAL | 0 refills | Status: AC | PRN

## 2019-01-18 MED ORDER — NAPROXEN SODIUM 550 MG PO TABS: 550.0000 mg | ORAL_TABLET | Freq: Two times a day (BID) | ORAL | 0 refills | Status: AC

## 2019-01-18 NOTE — Progress Notes (Addendum)
Was able to get Dr. On call and received additional pain medication that has help ease pt's pain since 2200.  Patient verbalized 6/10 pain at this time.  Also has hematuria X 2.  Will continue to monitor this morning.

## 2019-01-18 NOTE — Plan of Care (Signed)
Reviewed discharge instructions with patient; copy given. IV removed. Patient ready for discharge.  

## 2019-01-18 NOTE — Plan of Care (Signed)
Patient lying in bed; no complaints of pain. Anticipates discharge today. Will continue to monitor.

## 2019-01-18 NOTE — Discharge Summary (Signed)
Patient ID: Lauren Wiley MRN: [id number] DOB/AGE: 06/24/1971 48 y.o.  Admit date: 01/17/2019 Discharge date: 01/18/2019  Supervising Physician: Arne Cleveland  Patient Status: [facility] - In-pt  Admission Diagnoses: Symptomatic uterine fibroids  Discharge Diagnoses:  Active Problems:   Uterus fibroma   Discharged Condition: good  Hospital Course:   Lauren Wiley is a 48 y.o. female who was seen by Dr. Laurence Ferrari on 06/12/2018 to discuss treatment options for symptomatic uterine fibroids.    She was deemed an appropriate candidate for bilateral uterine artery embolization.  Yesterday she underwent successful superior hypogastric nerve block along with technically successful bilateral uterine artery embolization.  She was doing well on post procedure rounds yesterday afternoon, but did develop some severe pelvic pain during Lauren night and had to have addition analgesic ordered.  This morning, she is feeling much better.  She has ambulated. She is ready for discharge home.  Consults: None  Significant Diagnostic Studies:  Lab Results  Component Value Date   WBC 3.1 (L) 01/17/2019   HGB 11.8 (L) 01/17/2019   HCT 38.8 01/17/2019   MCV 84.0 01/17/2019   PLT 197 01/17/2019   Lab Results  Component Value Date   INR 0.9 01/17/2019   Lab Results  Component Value Date   CREATININE 0.59 01/17/2019   BUN 9 01/17/2019   NA 138 01/17/2019   K 3.8 01/17/2019   CL 105 01/17/2019   CO2 25 01/17/2019    Treatments:  INDICATION: 49 year old female with symptomatic uterine fibroids resulting in menorrhagia and chronic anemia.  EXAM: 1. Ultrasound-guided vascular access left radial artery 2. Catheterization of Lauren abdominal aorta with distal aortogram 3. Fluoroscopic guided superior hypogastric nerve block 4. Catheterization of Lauren right internal iliac artery with arteriogram 5. Catheterization of Lauren right uterine artery with arteriogram 6. Particle embolization  of Lauren right uterine artery 7. Catheterization of Lauren left internal iliac artery with arteriogram 8. Catheterization of Lauren left uterine artery with arteriogram 9. Particle embolization of Lauren left uterine artery.  MEDICATIONS: 1 g Rocephin, 8 mg Zofran and 8 mg Decadron. Lauren antibiotic was administered within 1 hour of Lauren procedure  ANESTHESIA/SEDATION: Moderate (conscious) sedation was employed during this procedure. A total of Versed 7 mg and Fentanyl 150 mcg was administered intravenously.  Moderate Sedation Time: 110 minutes minutes. Lauren patient's level of consciousness and vital signs were monitored continuously by radiology nursing throughout Lauren procedure under my direct supervision.  CONTRAST:  125 Omnipaque 300  FLUOROSCOPY TIME:  Fluoroscopy Time: 29 minutes 0 seconds (1664 mGy).  COMPLICATIONS: None immediate.  PROCEDURE: Informed consent was obtained from Lauren patient following explanation of Lauren procedure, risks, benefits and alternatives. Lauren patient understands, agrees and consents for Lauren procedure. All questions were addressed. A time out was performed prior to Lauren initiation of Lauren procedure. Maximal barrier sterile technique utilized including caps, mask, sterile gowns, sterile gloves, large sterile drape, hand hygiene, and Betadine prep.  Lauren left radial artery was interrogated with ultrasound and found to be widely patent and of adequate diameter. An image was obtained and stored for Lauren medical record. Local anesthesia was attained by infiltration with 1% lidocaine. Under real-time sonographic guidance, Lauren vessel was punctured with a 21 gauge micropuncture needle. Using standard technique, Lauren initial micro needle was exchanged over a 0.021 micro wire for a hydrophilic 5 French slim arterial sheath. Lauren sheath was flushed and then a radial cocktail consisting of 5000 units heparin, 2.5 mg Verapamil and 200 mcg nitroglycerine was injected  through Lauren sheath.  A 125 cm 4 French angled glide catheter was then advanced over a Bentson wire into Lauren distal abdominal aorta. A distal abdominal aortogram was performed. Lauren aortic bifurcation is high at Lauren superior endplate of L4. Additionally, Lauren left common iliac artery is short. Lauren left internal iliac artery arises at Lauren level of Lauren mid L4 vertebral body.  Lauren I was then oriented so that Lauren L5 vertebral body was visualized en face. A 22 gauge Chiba needle was then carefully advanced down onto Lauren anterior surface of Lauren L5 vertebral body. A gentle hand injection of contrast material was performed confirming that Lauren needle tip was in Lauren pre-spinal soft tissues. This was confirmed with a lateral view. A total of 20 mL ropivacaine was then carefully injected. Several mL were injected and then Lauren patient observed to assess for tolerance. Lauren full volume was then slowly injected over several minutes. Lauren needle was removed and appropriately disposed of.  Lauren 4 French catheter was then navigated into Lauren right internal iliac artery. An internal iliac arteriogram was performed. Lauren uterine artery is hypertrophic. Tumor blush consistent with a large uterine fibroid is evident. A Renegade high flow microcatheter was then advanced into Lauren horizontal segment of Lauren right uterine artery. Arteriography was performed confirming no significant proximal vesico vaginal branches. Particle embolization was then performed utilizing 1 vial of 500-700 micron embospheres and 75% of a vial of 700-900 micron embospheres. Post embolization arteriography demonstrates near stasis. Lauren microcatheter was removed. Lauren 4 French angled catheter was brought back into Lauren abdominal aorta and advanced into Lauren left internal iliac artery. Arteriography was performed. Lauren left uterine artery is hypertrophic. There is a large tumor blush consistent with a uterine fibroid. Lauren high-flow Renegade  microcatheter was again advanced into Lauren horizontal segment of Lauren artery. Arteriography was performed confirming Lauren location of Lauren catheter tip. Particle embolization was then performed utilizing 1 vial of 500-700 micron embospheres and 25% of a vial of 700-900 micron embospheres.  Follow-up arteriography demonstrates near stasis.  Catheters were removed. Attention was turned to Lauren left radial sheath. An additional 200 mcg nitroglycerin in 2.5 mg verapamil were administered through Lauren sheath. Lauren sheath was then removed and hemostasis attained with a TR band. Lauren patient tolerated Lauren procedure well.  IMPRESSION: 1. Successful superior hypogastric nerve block. 2. Technically successful bilateral uterine artery embolization.   Electronically Signed   By: Jacqulynn Cadet M.D.   On: 01/17/2019 15:19  Discharge Exam: Blood pressure 99/62, pulse 70, temperature 98.3 F (36.8 C), temperature source Oral, resp. rate 18, height 5\' 5"  (1.651 m), weight 67.6 kg, last menstrual period 01/15/2019, SpO2 97 %. Awake and alert NAD Radial stick looks good Nerve block stick essentially not visible Heart Regular No respiratory distress  Disposition:   For Discharge home today.  Will arrange for f/u in 3-4 weeks.  Percocet, Zofran, and Naproxen eprescribed to [facility].   Electronically Signed: Murrell Redden, PA-C 01/18/2019, 8:41 AM     I have spent Less Than 30 Minutes discharging Lauren Wiley.

## 2019-02-04 ENCOUNTER — Telehealth: Payer: Self-pay | Admitting: Interventional Radiology

## 2019-02-04 ENCOUNTER — Other Ambulatory Visit: Payer: BLUE CROSS/BLUE SHIELD

## 2019-02-04 DIAGNOSIS — D259 Leiomyoma of uterus, unspecified: Secondary | ICD-10-CM | POA: Diagnosis not present

## 2019-02-04 NOTE — Telephone Encounter (Signed)
Chief Complaint: Patient was evaluated via telephone today for  Chief Complaint  Patient presents with  . Fibroids   Referring Physician(s): Dr. Servando Salina  History of Present Illness: Carolyne Whitsel is a 48 y.o. female with a history of symptomatic uterine fibroids and anemia requiring iron transfusion.  She underwent bilateral uterine artery embolization with a superior hypogastric nerve block on 01/17/2019.  I am calling her today for 2-week follow-up evaluation.  She reports that her postoperative course was relatively uneventful.  Her only complaint is significant constipation from the narcotic regimen.  She has now fully recovered and has no active complaints.  She is currently on her first menstrual cycle and reports that the bleeding is significantly lighter than in past months.  So far she is happy with her response.  Past Medical History:  Diagnosis Date  . Allergy   . Anemia   . Fibroids   . GERD (gastroesophageal reflux disease)   . Seasonal allergies   . Uterine fibroid     Past Surgical History:  Procedure Laterality Date  . IR ANGIOGRAM PELVIS SELECTIVE OR SUPRASELECTIVE  01/17/2019  . IR ANGIOGRAM PELVIS SELECTIVE OR SUPRASELECTIVE  01/17/2019  . IR ANGIOGRAM SELECTIVE EACH ADDITIONAL VESSEL  01/17/2019  . IR ANGIOGRAM SELECTIVE EACH ADDITIONAL VESSEL  01/17/2019  . IR EMBO TUMOR ORGAN ISCHEMIA INFARCT INC GUIDE ROADMAPPING  01/17/2019  . IR FLUORO GUIDED NEEDLE PLC ASPIRATION/INJECTION LOC  01/17/2019  . IR RADIOLOGIST EVAL & MGMT  06/12/2018  . IR US GUIDE VASC ACCESS LEFT  01/17/2019  . none      Allergies: Augmentin [amoxicillin-pot clavulanate]  Medications: Prior to Admission medications   Medication Sig Start Date End Date Taking? Authorizing Provider  Ascorbic Acid (VITAMIN C) 1000 MG tablet Take 1,000 mg by mouth daily.    [provider]  Ferrous Sulfate (IRON PO) Take 1 tablet by mouth daily.    [provider]   naproxen sodium (ALEVE) 220 MG tablet Take 440 mg by mouth 2 (two) times daily as needed (back pain).    [provider]  ondansetron (ZOFRAN) 4 MG tablet Take 1 tablet (4 mg total) by mouth every 8 (eight) hours as needed for nausea or vomiting. 01/18/19   Ardis Rowan, PA-C  oxyCODONE-acetaminophen (PERCOCET/ROXICET) 5-325 MG tablet Take 1-2 tablets by mouth every 4 (four) hours. 01/18/19   Ardis Rowan, PA-C  Probiotic Product (PROBIOTIC PO) Take 1 capsule by mouth daily.    [provider]     Family History  Problem Relation Age of Onset  . Arrhythmia Mother   . Diabetes Mellitus II Mother   . Hypertension Mother   . Hypotension Father   . Diabetes Mellitus II Father   . Heart attack Paternal Uncle   . Arrhythmia Maternal Grandmother   . Diabetes Mellitus II Maternal Grandmother   . Heart attack Paternal Grandmother   . Heart attack Paternal Grandfather   . Liver disease Maternal Grandfather        alcohol related  . Heart failure Neg Hx   . Colon cancer Neg Hx   . Esophageal cancer Neg Hx   . Pancreatic cancer Neg Hx   . Stomach cancer Neg Hx   . Rectal cancer Neg Hx     Social History   Socioeconomic History  . Marital status: Single    Spouse name: Not on file  . Number of children: Not on file  . Years of education: Not  on file  . Highest education level: Not on file  Occupational History  . Not on file  Social Needs  . Financial resource strain: Not on file  . Food insecurity:    Worry: Not on file    Inability: Not on file  . Transportation needs:    Medical: Not on file    Non-medical: Not on file  Tobacco Use  . Smoking status: Former Smoker    Last attempt to quit: 11/20/1990    Years since quitting: 28.2  . Smokeless tobacco: Never Used  Substance and Sexual Activity  . Alcohol use: Yes    Alcohol/week: 0.0 standard drinks    Comment: once every 2-3 months  . Drug use: No  . Sexual activity: Yes  Lifestyle  .  Physical activity:    Days per week: Not on file    Minutes per session: Not on file  . Stress: Not on file  Relationships  . Social connections:    Talks on phone: Not on file    Gets together: Not on file    Attends religious service: Not on file    Active member of club or organization: Not on file    Attends meetings of clubs or organizations: Not on file    Relationship status: Not on file  Other Topics Concern  . 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      Review of Systems: A 12 point ROS discussed and pertinent positives are indicated in the HPI above.  All other systems are negative.   Labs:  CBC: Recent Labs    05/10/18 1000 01/17/19 0834  WBC 4.4 3.1*  HGB 12.4 11.8*  HCT 38.4 38.8  PLT 142.0* 197    COAGS: Recent Labs    01/17/19 0834  INR 0.9    BMP: Recent Labs    05/10/18 1000 01/17/19 0834  NA 136 138  K 3.8 3.8  CL 104 105  CO2 25 25  GLUCOSE 91 94  BUN 9 9  CALCIUM 9.0 9.0  CREATININE 0.62 0.59  GFRNONAA  --  >60  GFRAA  --  >60    LIVER FUNCTION TESTS: Recent Labs    05/10/18 1000  BILITOT 0.4  AST 14  ALT 16  ALKPHOS 53  PROT 7.1  ALBUMIN 4.3    TUMOR MARKERS: No results for input(s): AFPTM, CEA, CA199, CHROMGRNA in the last 8760 hours.  Assessment and Plan:  Doing very well 2 weeks status post superior hypogastric nerve block and bilateral uterine artery embolization her postoperative course was uncomplicated and she has begun her next menstrual cycle.  She reports that so far, flow has been significantly improved compared to pretreatment.  1.)  Telephone check-in by office staff in 3 months. 2.)  Return to clinic in 6 months.   Electronically Signed: Jacqulynn Cadet 02/04/2019, 4:53 PM   I spent a total of  10 Minutes in face to face in clinical consultation, greater than 50% of which was  counseling/coordinating care for uterine fibroids.

## 2019-07-07 ENCOUNTER — Other Ambulatory Visit: Payer: Self-pay | Admitting: Interventional Radiology

## 2019-07-07 DIAGNOSIS — D250 Submucous leiomyoma of uterus: Secondary | ICD-10-CM

## 2019-09-09 ENCOUNTER — Other Ambulatory Visit: Payer: Self-pay

## 2019-09-09 ENCOUNTER — Encounter: Payer: Self-pay | Admitting: *Deleted

## 2019-09-09 ENCOUNTER — Ambulatory Visit
Admission: RE | Admit: 2019-09-09 | Discharge: 2019-09-09 | Disposition: A | Discharge status: Home / Self Care | Payer: BLUE CROSS/BLUE SHIELD | Source: Ambulatory Visit | Attending: Interventional Radiology | Admitting: Interventional Radiology

## 2019-09-09 DIAGNOSIS — D250 Submucous leiomyoma of uterus: Secondary | ICD-10-CM

## 2019-09-09 HISTORY — PX: IR RADIOLOGIST EVAL & MGMT: IMG5224

## 2019-09-09 NOTE — Progress Notes (Signed)
Chief Complaint: Patient was consulted remotely today (TeleHealth) for uterine fibroids at the request of [place].    Referring Physician(s): Asencion Loveday  History of Present Illness: Lauren Wiley is a 48 y.o. female with a history of symptomatic uterine fibroids and anemia requiring iron transfusion.  She underwent bilateral uterine artery embolization with a superior hypogastric nerve block on 01/17/2019.  I am calling her today for 8 month follow-up evaluation.  She reports that her postoperative course was relatively uneventful.  She has now fully recovered and has no active complaints.    Her cycles remain relatively regular and significantly improved compared to before her embolization procedure.  She has no active complaints.   Past Medical History:  Diagnosis Date  . Allergy   . Anemia   . Fibroids   . GERD (gastroesophageal reflux disease)   . Seasonal allergies   . Uterine fibroid     Past Surgical History:  Procedure Laterality Date  . IR ANGIOGRAM PELVIS SELECTIVE OR SUPRASELECTIVE  01/17/2019  . IR ANGIOGRAM PELVIS SELECTIVE OR SUPRASELECTIVE  01/17/2019  . IR ANGIOGRAM SELECTIVE EACH ADDITIONAL VESSEL  01/17/2019  . IR ANGIOGRAM SELECTIVE EACH ADDITIONAL VESSEL  01/17/2019  . IR EMBO TUMOR ORGAN ISCHEMIA INFARCT INC GUIDE ROADMAPPING  01/17/2019  . IR FLUORO GUIDED NEEDLE PLC ASPIRATION/INJECTION LOC  01/17/2019  . IR RADIOLOGIST EVAL & MGMT  06/12/2018  . IR RADIOLOGIST EVAL & MGMT  09/09/2019  . IR US GUIDE VASC ACCESS LEFT  01/17/2019  . none      Allergies: Augmentin [amoxicillin-pot clavulanate]  Medications: Prior to Admission medications   Medication Sig Start Date End Date Taking? Authorizing Provider  Ascorbic Acid (VITAMIN C) 1000 MG tablet Take 1,000 mg by mouth daily.    [provider]  Ferrous Sulfate (IRON PO) Take 1 tablet by mouth daily.    [provider]  naproxen sodium (ALEVE) 220 MG tablet Take 440  mg by mouth 2 (two) times daily as needed (back pain).    [provider]  ondansetron (ZOFRAN) 4 MG tablet Take 1 tablet (4 mg total) by mouth every 8 (eight) hours as needed for nausea or vomiting. 01/18/19   Ardis Rowan, PA-C  oxyCODONE-acetaminophen (PERCOCET/ROXICET) 5-325 MG tablet Take 1-2 tablets by mouth every 4 (four) hours. 01/18/19   Ardis Rowan, PA-C  Probiotic Product (PROBIOTIC PO) Take 1 capsule by mouth daily.    [provider]     Family History  Problem Relation Age of Onset  . Arrhythmia Mother   . Diabetes Mellitus II Mother   . Hypertension Mother   . Hypotension Father   . Diabetes Mellitus II Father   . Heart attack Paternal Uncle   . Arrhythmia Maternal Grandmother   . Diabetes Mellitus II Maternal Grandmother   . Heart attack Paternal Grandmother   . Heart attack Paternal Grandfather   . Liver disease Maternal Grandfather        alcohol related  . Heart failure Neg Hx   . Colon cancer Neg Hx   . Esophageal cancer Neg Hx   . Pancreatic cancer Neg Hx   . Stomach cancer Neg Hx   . Rectal cancer Neg Hx     Social History   Socioeconomic History  . Marital status: Single    Spouse name: Not on file  . Number of children: Not on file  . Years of education: Not on file  . Highest education level: Not on file  Occupational History  . Not on file  Social Needs  . Financial resource strain: Not on file  . Food insecurity    Worry: Not on file    Inability: Not on file  . Transportation needs    Medical: Not on file    Non-medical: Not on file  Tobacco Use  . Smoking status: Former Smoker    Quit date: 11/20/1990    Years since quitting: 28.8  . Smokeless tobacco: Never Used  Substance and Sexual Activity  . Alcohol use: Yes    Alcohol/week: 0.0 standard drinks    Comment: once every 2-3 months  . Drug use: No  . Sexual activity: Yes  Lifestyle  . Physical activity    Days per week: Not on file    Minutes per  session: Not on file  . Stress: Not on file  Relationships  . Social Herbalist on phone: Not on file    Gets together: Not on file    Attends religious service: Not on file    Active member of club or organization: Not on file    Attends meetings of clubs or organizations: Not on file    Relationship status: Not on file  Other Topics Concern  . 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      Review of Systems  Review of Systems: A 12 point ROS discussed and pertinent positives are indicated in the HPI above.  All other systems are negative.  Physical Exam No direct physical exam was performed (except for noted visual exam findings with Video Visits).   Vital Signs: There were no vitals taken for this visit.  Imaging: Ir Radiologist Eval & Mgmt  Result Date: 09/09/2019 Please refer to notes tab for details about interventional procedure. (Op Note)   Labs:  CBC: Recent Labs    01/17/19 0834  WBC 3.1*  HGB 11.8*  HCT 38.8  PLT 197    COAGS: Recent Labs    01/17/19 0834  INR 0.9    BMP: Recent Labs    01/17/19 0834  NA 138  K 3.8  CL 105  CO2 25  GLUCOSE 94  BUN 9  CALCIUM 9.0  CREATININE 0.59  GFRNONAA >60  GFRAA >60    LIVER FUNCTION TESTS: No results for input(s): BILITOT, AST, ALT, ALKPHOS, PROT, ALBUMIN in the last 8760 hours.  TUMOR MARKERS: No results for input(s): AFPTM, CEA, CA199, CHROMGRNA in the last 8760 hours.  Assessment and Plan:  Continues to do very well 8 months status post bilateral uterine artery embolization.  Her cycles remain improved and easier to manage compared to before her embolization procedure.  She has no active complaints at this time.  She has recently moved and no longer resides in [place].  Further follow-up as needed only.   Electronically Signed: Jacqulynn Cadet 09/09/2019, 12:45 PM    I spent a total of  15 Minutes in remote  clinical consultation, greater than 50% of which was counseling/coordinating care for uterine fibroids.    Visit type: Audio only (telephone). Audio (no video) only due to patient preference.. Alternative for in-person consultation at [facility], [address], [place], [place]. This visit type was conducted due to national recommendations for restrictions regarding the COVID-19 Pandemic (e.g. social distancing).  This format is felt to be most  appropriate for this patient at this time.  All issues noted in this document were discussed and addressed.
